# Patient Record
Sex: Male | Born: 1965
Health system: Southern US, Community
[De-identification: ages and names within clinical notes are randomized; demographics above are authoritative.]

## PROBLEM LIST (undated history)

## (undated) DIAGNOSIS — I10 Essential (primary) hypertension: Secondary | ICD-10-CM

## (undated) DIAGNOSIS — E785 Hyperlipidemia, unspecified: Secondary | ICD-10-CM

## (undated) DIAGNOSIS — M2392 Unspecified internal derangement of left knee: Secondary | ICD-10-CM

## (undated) DIAGNOSIS — K219 Gastro-esophageal reflux disease without esophagitis: Secondary | ICD-10-CM

## (undated) DIAGNOSIS — E119 Type 2 diabetes mellitus without complications: Secondary | ICD-10-CM

## (undated) DIAGNOSIS — E039 Hypothyroidism, unspecified: Secondary | ICD-10-CM

## (undated) DIAGNOSIS — M5432 Sciatica, left side: Secondary | ICD-10-CM

## (undated) HISTORY — DX: Hypothyroidism, unspecified: E03.9

## (undated) HISTORY — DX: Essential (primary) hypertension: I10

## (undated) HISTORY — PX: TONSILLECTOMY: SUR1361

## (undated) HISTORY — PX: LIPOMA EXCISION: SHX5283

## (undated) HISTORY — DX: Hyperlipidemia, unspecified: E78.5

---

## 2009-10-05 DIAGNOSIS — E118 Type 2 diabetes mellitus with unspecified complications: Secondary | ICD-10-CM | POA: Insufficient documentation

## 2009-10-05 DIAGNOSIS — E1169 Type 2 diabetes mellitus with other specified complication: Secondary | ICD-10-CM | POA: Insufficient documentation

## 2009-10-05 DIAGNOSIS — E1165 Type 2 diabetes mellitus with hyperglycemia: Secondary | ICD-10-CM | POA: Insufficient documentation

## 2013-08-08 ENCOUNTER — Ambulatory Visit: Payer: Self-pay | Admitting: Physical Medicine and Rehabilitation

## 2015-10-10 DIAGNOSIS — E119 Type 2 diabetes mellitus without complications: Secondary | ICD-10-CM | POA: Diagnosis not present

## 2015-10-10 DIAGNOSIS — Z0000 Encounter for general adult medical examination without abnormal findings: Secondary | ICD-10-CM | POA: Diagnosis not present

## 2015-10-10 DIAGNOSIS — Z125 Encounter for screening for malignant neoplasm of prostate: Secondary | ICD-10-CM | POA: Diagnosis not present

## 2015-10-10 DIAGNOSIS — I158 Other secondary hypertension: Secondary | ICD-10-CM | POA: Diagnosis not present

## 2015-10-10 DIAGNOSIS — E039 Hypothyroidism, unspecified: Secondary | ICD-10-CM | POA: Diagnosis not present

## 2015-10-10 DIAGNOSIS — R7889 Finding of other specified substances, not normally found in blood: Secondary | ICD-10-CM | POA: Diagnosis not present

## 2015-10-10 DIAGNOSIS — E784 Other hyperlipidemia: Secondary | ICD-10-CM | POA: Diagnosis not present

## 2015-10-10 DIAGNOSIS — E559 Vitamin D deficiency, unspecified: Secondary | ICD-10-CM | POA: Diagnosis not present

## 2015-10-16 ENCOUNTER — Telehealth: Payer: Self-pay | Admitting: Gastroenterology

## 2015-10-16 ENCOUNTER — Other Ambulatory Visit: Payer: Self-pay

## 2015-10-16 NOTE — Telephone Encounter (Signed)
Colonoscopy triage °

## 2015-10-16 NOTE — Telephone Encounter (Signed)
LVM for pt to return my call.

## 2015-10-16 NOTE — Telephone Encounter (Signed)
Pt scheduled for screening colonoscopy at [facility] on 11/04/15.

## 2015-10-16 NOTE — Telephone Encounter (Signed)
Gastroenterology Pre-Procedure Review  Request Date: 11/04/15 Requesting Physician: Dr. Rosario Jacks  PATIENT REVIEW QUESTIONS: The patient responded to the following health history questions as indicated:    1. Are you having any GI issues? yes (Hemorroids) 2. Do you have a personal history of Polyps? no 3. Do you have a family history of Colon Cancer or Polyps? no 4. Diabetes Mellitus? no 5. Joint replacements in the past 12 months?no 6. Major health problems in the past 3 months?no 7. Any artificial heart valves, MVP, or defibrillator?no    MEDICATIONS & ALLERGIES:    Patient reports the following regarding taking any anticoagulation/antiplatelet therapy:   Plavix, Coumadin, Eliquis, Xarelto, Lovenox, Pradaxa, Brilinta, or Effient? no Aspirin? no  Patient confirms/reports the following medications:  Current Outpatient Prescriptions  Medication Sig Dispense Refill  . levothyroxine (SYNTHROID, LEVOTHROID) 88 MCG tablet Take 88 mcg by mouth daily before breakfast.    . lisinopril (PRINIVIL,ZESTRIL) 10 MG tablet Take 10 mg by mouth daily.     No current facility-administered medications for this visit.    Patient confirms/reports the following allergies:  Allergies no known allergies  No orders of the defined types were placed in this encounter.    AUTHORIZATION INFORMATION Primary Insurance: 1D#: Group #:  Secondary Insurance: 1D#: Group #:  SCHEDULE INFORMATION: Date: 11/04/15 Time: Location: [place]

## 2015-10-30 NOTE — Telephone Encounter (Signed)
No authorization is required with [facility] per Sharee Pimple T for CPT: [phone]. 11/04/15 date of service.

## 2015-11-01 NOTE — Discharge Instructions (Signed)

## 2015-11-04 ENCOUNTER — Ambulatory Visit: Admission: RE | Admit: 2015-11-04 | Payer: 59 | Source: Ambulatory Visit | Admitting: Gastroenterology

## 2015-11-04 ENCOUNTER — Encounter: Admission: RE | Payer: Self-pay | Source: Ambulatory Visit

## 2015-11-04 SURGERY — COLONOSCOPY WITH PROPOFOL
Anesthesia: Choice

## 2015-11-14 ENCOUNTER — Other Ambulatory Visit: Payer: Self-pay

## 2015-11-19 ENCOUNTER — Encounter: Payer: Self-pay | Admitting: *Deleted

## 2015-11-21 NOTE — Discharge Instructions (Signed)

## 2015-11-25 ENCOUNTER — Encounter
Admission: RE | Disposition: A | Discharge status: Home / Self Care | Payer: Self-pay | Source: Ambulatory Visit | Attending: Gastroenterology

## 2015-11-25 ENCOUNTER — Ambulatory Visit: Payer: 59 | Admitting: Anesthesiology

## 2015-11-25 ENCOUNTER — Ambulatory Visit
Admission: RE | Admit: 2015-11-25 | Discharge: 2015-11-25 | Disposition: A | Discharge status: Home / Self Care | Payer: 59 | Source: Ambulatory Visit | Attending: Gastroenterology | Admitting: Gastroenterology

## 2015-11-25 DIAGNOSIS — F17290 Nicotine dependence, other tobacco product, uncomplicated: Secondary | ICD-10-CM | POA: Insufficient documentation

## 2015-11-25 DIAGNOSIS — D124 Benign neoplasm of descending colon: Secondary | ICD-10-CM | POA: Insufficient documentation

## 2015-11-25 DIAGNOSIS — E119 Type 2 diabetes mellitus without complications: Secondary | ICD-10-CM | POA: Insufficient documentation

## 2015-11-25 DIAGNOSIS — D125 Benign neoplasm of sigmoid colon: Secondary | ICD-10-CM | POA: Diagnosis not present

## 2015-11-25 DIAGNOSIS — Z1211 Encounter for screening for malignant neoplasm of colon: Secondary | ICD-10-CM | POA: Insufficient documentation

## 2015-11-25 DIAGNOSIS — Z79899 Other long term (current) drug therapy: Secondary | ICD-10-CM | POA: Insufficient documentation

## 2015-11-25 DIAGNOSIS — K641 Second degree hemorrhoids: Secondary | ICD-10-CM | POA: Insufficient documentation

## 2015-11-25 DIAGNOSIS — K635 Polyp of colon: Secondary | ICD-10-CM | POA: Diagnosis not present

## 2015-11-25 HISTORY — DX: Type 2 diabetes mellitus without complications: E11.9

## 2015-11-25 HISTORY — PX: COLONOSCOPY WITH PROPOFOL: SHX5780

## 2015-11-25 HISTORY — PX: POLYPECTOMY: SHX149

## 2015-11-25 HISTORY — DX: Sciatica, left side: M54.32

## 2015-11-25 SURGERY — COLONOSCOPY WITH PROPOFOL
Anesthesia: Monitor Anesthesia Care | Wound class: Contaminated

## 2015-11-25 MED ORDER — LACTATED RINGERS IV SOLN
INTRAVENOUS | Status: DC
  Administered 2015-11-25: 10:00:00 via INTRAVENOUS

## 2015-11-25 MED ORDER — PROPOFOL 10 MG/ML IV BOLUS
INTRAVENOUS | Status: DC | PRN
  Administered 2015-11-25: 30 mg via INTRAVENOUS
  Administered 2015-11-25: 50 mg via INTRAVENOUS
  Administered 2015-11-25 (×6): 20 mg via INTRAVENOUS

## 2015-11-25 MED ORDER — LIDOCAINE HCL (CARDIAC) 20 MG/ML IV SOLN
INTRAVENOUS | Status: DC | PRN
  Administered 2015-11-25: 50 mg via INTRAVENOUS

## 2015-11-25 SURGICAL SUPPLY — 28 items
CANISTER SUCT 1200ML W/VALVE (MISCELLANEOUS) ×4 IMPLANT
FCP ESCP3.2XJMB 240X2.8X (MISCELLANEOUS)
FORCEPS BIOP RAD 4 LRG CAP 4 (CUTTING FORCEPS) IMPLANT
FORCEPS BIOP RJ4 240 W/NDL (MISCELLANEOUS)
FORCEPS ESCP3.2XJMB 240X2.8X (MISCELLANEOUS) IMPLANT
GOWN CVR UNV OPN BCK APRN NK (MISCELLANEOUS) ×4 IMPLANT
GOWN ISOL THUMB LOOP REG UNIV (MISCELLANEOUS) ×4
HEMOCLIP INSTINCT (CLIP) IMPLANT
INJECTOR VARIJECT VIN23 (MISCELLANEOUS) IMPLANT
KIT CO2 TUBING (TUBING) IMPLANT
KIT DEFENDO VALVE AND CONN (KITS) IMPLANT
KIT ENDO PROCEDURE OLY (KITS) ×4 IMPLANT
LIGATOR MULTIBAND 6SHOOTER MBL (MISCELLANEOUS) IMPLANT
MARKER SPOT ENDO TATTOO 5ML (MISCELLANEOUS) IMPLANT
PAD GROUND ADULT SPLIT (MISCELLANEOUS) IMPLANT
SNARE SHORT THROW 13M SML OVAL (MISCELLANEOUS) ×4 IMPLANT
SNARE SHORT THROW 30M LRG OVAL (MISCELLANEOUS) IMPLANT
SPOT EX ENDOSCOPIC TATTOO (MISCELLANEOUS)
SUCTION POLY TRAP 4CHAMBER (MISCELLANEOUS) IMPLANT
TRAP SUCTION POLY (MISCELLANEOUS) ×4 IMPLANT
TUBING CONN 6MMX3.1M (TUBING)
TUBING SUCTION CONN 0.25 STRL (TUBING) IMPLANT
UNDERPAD 30X60 958B10 (PK) (MISCELLANEOUS) IMPLANT
VALVE BIOPSY ENDO (VALVE) IMPLANT
VARIJECT INJECTOR VIN23 (MISCELLANEOUS)
WATER AUXILLARY (MISCELLANEOUS) IMPLANT
WATER STERILE IRR 250ML POUR (IV SOLUTION) ×4 IMPLANT
WATER STERILE IRR 500ML POUR (IV SOLUTION) IMPLANT

## 2015-11-25 NOTE — Transfer of Care (Signed)
Immediate Anesthesia Transfer of Care Note  Patient: Jonathan Thomas  Procedure(s) Performed: Procedure(s) with comments: COLONOSCOPY WITH PROPOFOL (N/A) POLYPECTOMY INTESTINAL - Descending colon polyp Sigmoid colon polyp Both cold snare.  Patient Location: PACU  Anesthesia Type: MAC  Level of Consciousness: awake, alert  and patient cooperative  Airway and Oxygen Therapy: Patient Spontanous Breathing and Patient connected to supplemental oxygen  Post-op Assessment: Post-op Vital signs reviewed, Patient's Cardiovascular Status Stable, Respiratory Function Stable, Patent Airway and No signs of Nausea or vomiting  Post-op Vital Signs: Reviewed and stable  Complications: No apparent anesthesia complications

## 2015-11-25 NOTE — H&P (Signed)
  [facility]  [address] [place], [place] [address] Phone: [phone] Fax : [phone]  Primary Care Physician:  No primary care provider on file. Primary Gastroenterologist:  Dr. Allen Norris  Pre-Procedure History & Physical: HPI:  Jonathan Thomas is a 50 y.o. male is here for a screening colonoscopy.   Past Medical History  Diagnosis Date  . Diabetes mellitus without complication ([place])     in past - no meds since 2012  . Sciatica of left side     Past Surgical History  Procedure Laterality Date  . Tonsillectomy      as child    Prior to Admission medications   Medication Sig Start Date End Date Taking? Authorizing Provider  levothyroxine (SYNTHROID, LEVOTHROID) 88 MCG tablet Take 88 mcg by mouth daily before breakfast.   Yes Historical Provider, MD  lisinopril (PRINIVIL,ZESTRIL) 10 MG tablet Take 10 mg by mouth daily. Only takes about 1x/wk   Yes Historical Provider, MD  Multiple Vitamin (MULTIVITAMIN) capsule Take 1 capsule by mouth daily.   Yes Historical Provider, MD  Omega-3 Fatty Acids (FISH OIL PO) Take by mouth daily.   Yes Historical Provider, MD    Allergies as of 11/14/2015  . (No Known Allergies)    History reviewed. No pertinent family history.  Social History   Social History  . Marital Status: Married    Spouse Name: N/A  . Number of Children: N/A  . Years of Education: N/A   Occupational History  . Not on file.   Social History Main Topics  . Smoking status: Current Some Day Smoker    Types: Cigars  . Smokeless tobacco: Not on file     Comment: 1 cigar/wk.  Quit cigarettes 25+ yrs ago.  . Alcohol Use: No  . Drug Use: Not on file  . Sexual Activity: Not on file   Other Topics Concern  . Not on file   Social History Narrative    Review of Systems: See HPI, otherwise negative ROS  Physical Exam: BP 126/84 mmHg  Pulse 71  Temp(Src) 97.7 F (36.5 C) (Temporal)  Resp 16  Ht 5\' 8"  (1.727 m)  Wt 172 lb (78.019  kg)  BMI 26.16 kg/m2  SpO2 95% General:   Alert,  pleasant and cooperative in NAD Head:  Normocephalic and atraumatic. Neck:  Supple; no masses or thyromegaly. Lungs:  Clear throughout to auscultation.    Heart:  Regular rate and rhythm. Abdomen:  Soft, nontender and nondistended. Normal bowel sounds, without guarding, and without rebound.   Neurologic:  Alert and  oriented x4;  grossly normal neurologically.  Impression/Plan: Jonathan Thomas is now here to undergo a screening colonoscopy.  Risks, benefits, and alternatives regarding colonoscopy have been reviewed with the patient.  Questions have been answered.  All parties agreeable.

## 2015-11-25 NOTE — Op Note (Signed)
[facility] Gastroenterology Patient Name: Jonathan Thomas Procedure Date: 11/25/2015 10:50 AM MRN: [id number] Account #: [id number] Date of Birth: 09/20/1966 Admit Type: Outpatient Age: 50 Room: [facility] OR ROOM 01 Gender: Male Note Status: Finalized Procedure:            Colonoscopy Indications:          Screening for colorectal malignant neoplasm Providers:            Lucilla Lame, MD Referring MD:         Casilda Carls, MD (Referring MD) Medicines:            Propofol per Anesthesia Complications:        No immediate complications. Procedure:            Pre-Anesthesia Assessment:                       - Prior to the procedure, a History and Physical was                        performed, and patient medications and allergies were                        reviewed. The patient's tolerance of previous                        anesthesia was also reviewed. The risks and benefits of                        the procedure and the sedation options and risks were                        discussed with the patient. All questions were                        answered, and informed consent was obtained. Prior                        Anticoagulants: The patient has taken no previous                        anticoagulant or antiplatelet agents. ASA Grade                        Assessment: II - A patient with mild systemic disease.                        After reviewing the risks and benefits, the patient was                        deemed in satisfactory condition to undergo the                        procedure.                       After obtaining informed consent, the colonoscope was                        passed under direct vision. Throughout the procedure,  the patient's blood pressure, pulse, and oxygen                        saturations were monitored continuously. The Olympus CF                        H180AL colonoscope (S#: [id number]) was introduced through                        the anus and advanced to the the cecum, identified by                        appendiceal orifice and ileocecal valve. The                        colonoscopy was performed without difficulty. The                        patient tolerated the procedure well. The quality of                        the bowel preparation was excellent. Findings:      The perianal and digital rectal examinations were normal.      A 4 mm polyp was found in the descending colon. The polyp was sessile.       The polyp was removed with a cold snare. Resection and retrieval were       complete.      Two sessile polyps were found in the sigmoid colon. The polyps were 3 to       4 mm in size. These polyps were removed with a cold snare. Resection and       retrieval were complete.      Non-bleeding internal hemorrhoids were found during retroflexion. The       hemorrhoids were Grade II (internal hemorrhoids that prolapse but reduce       spontaneously). Impression:           - One 4 mm polyp in the descending colon, removed with                        a cold snare. Resected and retrieved.                       - Two 3 to 4 mm polyps in the sigmoid colon, removed                        with a cold snare. Resected and retrieved.                       - Non-bleeding internal hemorrhoids. Recommendation:       - Await pathology results.                       - Repeat colonoscopy in 5 years if polyp adenoma and 10                        years if hyperplastic Procedure Code(s):    --- Professional ---  45385, Colonoscopy, flexible; with removal of tumor(s),                        polyp(s), or other lesion(s) by snare technique Diagnosis Code(s):    --- Professional ---                       Z12.11, Encounter for screening for malignant neoplasm                        of colon                       D12.4, Benign neoplasm of descending colon                       D12.5, Benign  neoplasm of sigmoid colon CPT copyright 2016 American Medical Association. All rights reserved. The codes documented in this report are preliminary and upon coder review may  be revised to meet current compliance requirements. Lucilla Lame, MD 11/25/2015 11:17:23 AM This report has been signed electronically. Number of Addenda: 0 Note Initiated On: 11/25/2015 10:50 AM Scope Withdrawal Time: 0 hours 10 minutes 13 seconds  Total Procedure Duration: 0 hours 11 minutes 12 seconds       [facility]

## 2015-11-25 NOTE — Anesthesia Postprocedure Evaluation (Signed)
Anesthesia Post Note  Patient: Jonathan Thomas  Procedure(s) Performed: Procedure(s) (LRB): COLONOSCOPY WITH PROPOFOL (N/A) POLYPECTOMY INTESTINAL  Patient location during evaluation: PACU Anesthesia Type: MAC Level of consciousness: awake and alert Pain management: pain level controlled Vital Signs Assessment: post-procedure vital signs reviewed and stable Respiratory status: spontaneous breathing, nonlabored ventilation, respiratory function stable and patient connected to nasal cannula oxygen Cardiovascular status: blood pressure returned to baseline and stable Postop Assessment: no signs of nausea or vomiting Anesthetic complications: no    Alisa Graff

## 2015-11-25 NOTE — Anesthesia Preprocedure Evaluation (Signed)
Anesthesia Evaluation  Patient identified by MRN, date of birth, ID band Patient awake    Reviewed: Allergy & Precautions, H&P , NPO status , Patient's Chart, lab work & pertinent test results, reviewed documented beta blocker date and time   Airway Mallampati: II  TM Distance: >3 FB Neck ROM: full    Dental no notable dental hx.    Pulmonary neg pulmonary ROS, Current Smoker,    Pulmonary exam normal breath sounds clear to auscultation       Cardiovascular Exercise Tolerance: Good negative cardio ROS   Rhythm:regular Rate:Normal     Neuro/Psych negative neurological ROS  negative psych ROS   GI/Hepatic negative GI ROS, Neg liver ROS,   Endo/Other  diabetes  Renal/GU negative Renal ROS  negative genitourinary   Musculoskeletal   Abdominal   Peds  Hematology negative hematology ROS (+)   Anesthesia Other Findings   Reproductive/Obstetrics negative OB ROS                             Anesthesia Physical Anesthesia Plan  ASA: II  Anesthesia Plan: MAC   Post-op Pain Management:    Induction:   Airway Management Planned:   Additional Equipment:   Intra-op Plan:   Post-operative Plan:   Informed Consent: I have reviewed the patients History and Physical, chart, labs and discussed the procedure including the risks, benefits and alternatives for the proposed anesthesia with the patient or authorized representative who has indicated his/her understanding and acceptance.   Dental Advisory Given  Plan Discussed with: CRNA  Anesthesia Plan Comments:         Anesthesia Quick Evaluation

## 2015-11-25 NOTE — Anesthesia Procedure Notes (Signed)
Procedure Name: MAC Performed by: Jaylene Arrowood Pre-anesthesia Checklist: Patient identified, Emergency Drugs available, Suction available, Timeout performed and Patient being monitored Patient Re-evaluated:Patient Re-evaluated prior to inductionOxygen Delivery Method: Nasal cannula Placement Confirmation: positive ETCO2       

## 2015-11-26 ENCOUNTER — Encounter: Payer: Self-pay | Admitting: Gastroenterology

## 2015-11-28 ENCOUNTER — Encounter: Payer: Self-pay | Admitting: Gastroenterology

## 2016-01-10 ENCOUNTER — Other Ambulatory Visit: Payer: Self-pay | Admitting: Internal Medicine

## 2016-01-10 ENCOUNTER — Encounter: Payer: Self-pay | Admitting: General Surgery

## 2016-01-10 DIAGNOSIS — R7889 Finding of other specified substances, not normally found in blood: Secondary | ICD-10-CM | POA: Diagnosis not present

## 2016-01-10 DIAGNOSIS — E119 Type 2 diabetes mellitus without complications: Secondary | ICD-10-CM | POA: Diagnosis not present

## 2016-01-10 DIAGNOSIS — R1030 Lower abdominal pain, unspecified: Secondary | ICD-10-CM

## 2016-01-10 DIAGNOSIS — E559 Vitamin D deficiency, unspecified: Secondary | ICD-10-CM | POA: Diagnosis not present

## 2016-01-10 DIAGNOSIS — E032 Hypothyroidism due to medicaments and other exogenous substances: Secondary | ICD-10-CM | POA: Diagnosis not present

## 2016-01-10 DIAGNOSIS — E784 Other hyperlipidemia: Secondary | ICD-10-CM | POA: Diagnosis not present

## 2016-01-10 DIAGNOSIS — R109 Unspecified abdominal pain: Secondary | ICD-10-CM

## 2016-01-10 DIAGNOSIS — E039 Hypothyroidism, unspecified: Secondary | ICD-10-CM | POA: Diagnosis not present

## 2016-01-10 DIAGNOSIS — E781 Pure hyperglyceridemia: Secondary | ICD-10-CM | POA: Diagnosis not present

## 2016-01-14 ENCOUNTER — Ambulatory Visit
Admission: RE | Admit: 2016-01-14 | Discharge: 2016-01-14 | Disposition: A | Discharge status: Home / Self Care | Payer: 59 | Source: Ambulatory Visit | Attending: Internal Medicine | Admitting: Internal Medicine

## 2016-01-14 DIAGNOSIS — R1030 Lower abdominal pain, unspecified: Secondary | ICD-10-CM | POA: Diagnosis not present

## 2016-01-14 DIAGNOSIS — K760 Fatty (change of) liver, not elsewhere classified: Secondary | ICD-10-CM | POA: Diagnosis not present

## 2016-01-14 DIAGNOSIS — R109 Unspecified abdominal pain: Secondary | ICD-10-CM | POA: Diagnosis not present

## 2016-01-14 DIAGNOSIS — I700 Atherosclerosis of aorta: Secondary | ICD-10-CM | POA: Insufficient documentation

## 2016-01-14 MED ORDER — IOPAMIDOL (ISOVUE-300) INJECTION 61%
100.0000 mL | Freq: Once | INTRAVENOUS | Status: AC | PRN
  Administered 2016-01-14: 100 mL via INTRAVENOUS

## 2016-01-23 ENCOUNTER — Ambulatory Visit: Payer: Self-pay | Admitting: General Surgery

## 2016-02-11 ENCOUNTER — Encounter: Payer: Self-pay | Admitting: General Surgery

## 2016-02-11 ENCOUNTER — Ambulatory Visit (INDEPENDENT_AMBULATORY_CARE_PROVIDER_SITE_OTHER): Payer: 59 | Admitting: General Surgery

## 2016-02-11 VITALS — BP 130/82 | HR 68 | Resp 12 | Ht 68.0 in | Wt 177.0 lb

## 2016-02-11 DIAGNOSIS — M7989 Other specified soft tissue disorders: Secondary | ICD-10-CM

## 2016-02-11 DIAGNOSIS — M799 Soft tissue disorder, unspecified: Secondary | ICD-10-CM

## 2016-02-11 NOTE — Progress Notes (Signed)
Patient ID: Jonathan Thomas, male   DOB: 1966/08/15, 50 y.o.   MRN: [id number]  Chief Complaint  Patient presents with  . Other    lipoma    HPI Jonathan Thomas is a 50 y.o. male here today for a evaluation of a lipoma on left side of neck. Patient states he noticed this many years ago. He states it has gotten larger over the past 5 years. Denies pain.  He states he had a MD did an I/D when he was in his 32's. I have reviewed the history of present illness with the patient.   HPI  Past Medical History  Diagnosis Date  . Sciatica of left side   . Diabetes mellitus without complication ([place])     in past - no meds since 2012    Past Surgical History  Procedure Laterality Date  . Tonsillectomy      as child  . Colonoscopy with propofol N/A 11/25/2015    Procedure: COLONOSCOPY WITH PROPOFOL;  Surgeon: Lucilla Lame, MD;  Location: [place];  Service: Endoscopy;  Laterality: N/A;  . Polypectomy  11/25/2015    Procedure: POLYPECTOMY INTESTINAL;  Surgeon: Lucilla Lame, MD;  Location: [place];  Service: Endoscopy;;  Descending colon polyp Sigmoid colon polyp Both cold snare.    Family History  Problem Relation Age of Onset  . Lung cancer Father     Social History Social History  Substance Use Topics  . Smoking status: Current Some Day Smoker    Types: Cigars  . Smokeless tobacco: Never Used     Comment: 1 cigar/wk.  Quit cigarettes 25+ yrs ago.  . Alcohol Use: No    No Known Allergies  Current Outpatient Prescriptions  Medication Sig Dispense Refill  . atorvastatin (LIPITOR) 10 MG tablet Take 10 mg by mouth daily.    Marland Kitchen levothyroxine (SYNTHROID, LEVOTHROID) 88 MCG tablet Take 88 mcg by mouth daily before breakfast.    . Multiple Vitamin (MULTIVITAMIN) capsule Take 1 capsule by mouth daily.    . Omega-3 Fatty Acids (FISH OIL PO) Take by mouth daily.     No current facility-administered medications for this visit.    Review of Systems Review of  Systems  Constitutional: Negative.   Respiratory: Negative.   Cardiovascular: Negative.     Blood pressure 130/82, pulse 68, resp. rate 12, height 5\' 8"  (1.727 m), weight 177 lb (80.287 kg).  Physical Exam Physical Exam  Constitutional: He is oriented to person, place, and time. He appears well-developed and well-nourished.    HENT:  Mouth/Throat: Oropharynx is clear and moist.  Eyes: Conjunctivae are normal. No scleral icterus.  Cardiovascular: Normal rate and regular rhythm.   Pulmonary/Chest: Effort normal and breath sounds normal.  Lymphadenopathy:    He has no cervical adenopathy.  Neurological: He is alert and oriented to person, place, and time.  Skin: Skin is warm and dry.  Psychiatric: He has a normal mood and affect.    Data Reviewed Notes reviewed  Assessment  Well defined, non-tender mass. Initial impression is that mass is a fatty tumor.      Plan Discussed options with pt . Given conttinued increase in size he is agreeable to having it excised .    Patient to return to the office for excision with local anesthetic.     PCP:  Casilda Carls This information has been scribed by Karie Fetch RN, BSN,BC.    Torsha Lemus G 02/11/2016, 2:07 PM

## 2016-02-19 ENCOUNTER — Encounter: Payer: Self-pay | Admitting: General Surgery

## 2016-02-19 ENCOUNTER — Ambulatory Visit (INDEPENDENT_AMBULATORY_CARE_PROVIDER_SITE_OTHER): Payer: 59 | Admitting: General Surgery

## 2016-02-19 VITALS — BP 164/70 | HR 80 | Resp 12 | Ht 68.0 in | Wt 175.0 lb

## 2016-02-19 DIAGNOSIS — D170 Benign lipomatous neoplasm of skin and subcutaneous tissue of head, face and neck: Secondary | ICD-10-CM | POA: Diagnosis not present

## 2016-02-19 DIAGNOSIS — R221 Localized swelling, mass and lump, neck: Secondary | ICD-10-CM | POA: Diagnosis not present

## 2016-02-19 NOTE — Patient Instructions (Addendum)
Keep area clean and dry. You may shower after tomorrow. You may change the dressing daily. Leave the steri strips in place they will come off on their own. We will call with the pathology results.

## 2016-02-19 NOTE — Progress Notes (Signed)
Patient ID: Jonathan Thomas, male   DOB: Mar 26, 1966, 50 y.o.   MRN: [id number] Here for a scheduled excision of a lipoma from his neck.   Procedure: excision mass back of neck   Anesthetic: 10 ml 1% xylocaine mixed with 0.5% marcaine, 55ml plain 1% xylocaine  Prep: chloro prep  Description. Pt placed in prone position. After local anesthetic and prep, area was draped out. Elliptical skin incision made . The lipomatous mass was excised out fully. The deep part was adherent to the fascia ans part of fascia was removed with the mass. Bleeding controlled with cautery and sutures of 3-0 vicryl. Deep layers closed with 3-0 Vicryl. Skin closed with subcuticular 4-0 Vicryl. Steri strips with tr. Benzoin applied. Gauze dressing. No immediate problems from procedure.  Ex given for Tramadol 50mg   q6h prn, # 20  Follow up in 2 weeks.  PCP: Rosario Jacks This has been scribed by Lesly Rubenstein LPN

## 2016-02-20 ENCOUNTER — Encounter: Payer: Self-pay | Admitting: General Surgery

## 2016-02-24 ENCOUNTER — Telehealth: Payer: Self-pay | Admitting: *Deleted

## 2016-02-24 NOTE — Telephone Encounter (Signed)
-----   Message from Christene Lye, MD sent at 02/24/2016  1:04 PM EDT ----- Selinda Eon, please let pt pt know the pathology was normal.

## 2016-02-24 NOTE — Telephone Encounter (Signed)
Patient notified as instructed and pleased.   He was advised to keep follow up appointment with Dr. Jamal Collin as scheduled.

## 2016-03-09 ENCOUNTER — Encounter: Payer: Self-pay | Admitting: *Deleted

## 2016-03-10 ENCOUNTER — Encounter: Payer: Self-pay | Admitting: General Surgery

## 2016-03-10 ENCOUNTER — Ambulatory Visit (INDEPENDENT_AMBULATORY_CARE_PROVIDER_SITE_OTHER): Payer: 59 | Admitting: General Surgery

## 2016-03-10 VITALS — BP 118/78 | HR 60 | Resp 12 | Ht 68.0 in | Wt 178.0 lb

## 2016-03-10 DIAGNOSIS — R221 Localized swelling, mass and lump, neck: Secondary | ICD-10-CM

## 2016-03-10 NOTE — Patient Instructions (Addendum)
Patient to return as needed. 

## 2016-03-10 NOTE — Progress Notes (Signed)
This is a 50 year old male here today for his post op excision lipoma from back of neck. He states he is doing well. Minimal to no pain. I have reviewed the history of present illness with the patient.  Exam shows a well healed incision . No residual visible or palpable mass.  Path-lipoma. Pt advised.    Patient to return as needed.  PCP:  Rosario Jacks,     This information has been scribed by Gaspar Cola CMA.

## 2016-03-14 ENCOUNTER — Encounter: Payer: Self-pay | Admitting: General Surgery

## 2016-04-13 DIAGNOSIS — I158 Other secondary hypertension: Secondary | ICD-10-CM | POA: Diagnosis not present

## 2016-04-13 DIAGNOSIS — E781 Pure hyperglyceridemia: Secondary | ICD-10-CM | POA: Diagnosis not present

## 2016-04-13 DIAGNOSIS — E784 Other hyperlipidemia: Secondary | ICD-10-CM | POA: Diagnosis not present

## 2016-04-13 DIAGNOSIS — E039 Hypothyroidism, unspecified: Secondary | ICD-10-CM | POA: Diagnosis not present

## 2016-04-13 DIAGNOSIS — I10 Essential (primary) hypertension: Secondary | ICD-10-CM | POA: Diagnosis not present

## 2016-04-13 DIAGNOSIS — E032 Hypothyroidism due to medicaments and other exogenous substances: Secondary | ICD-10-CM | POA: Diagnosis not present

## 2016-04-13 DIAGNOSIS — R7889 Finding of other specified substances, not normally found in blood: Secondary | ICD-10-CM | POA: Diagnosis not present

## 2016-04-13 DIAGNOSIS — E559 Vitamin D deficiency, unspecified: Secondary | ICD-10-CM | POA: Diagnosis not present

## 2016-06-18 DIAGNOSIS — J309 Allergic rhinitis, unspecified: Secondary | ICD-10-CM | POA: Diagnosis not present

## 2016-07-15 DIAGNOSIS — I158 Other secondary hypertension: Secondary | ICD-10-CM | POA: Diagnosis not present

## 2016-07-15 DIAGNOSIS — E032 Hypothyroidism due to medicaments and other exogenous substances: Secondary | ICD-10-CM | POA: Diagnosis not present

## 2016-07-15 DIAGNOSIS — E039 Hypothyroidism, unspecified: Secondary | ICD-10-CM | POA: Diagnosis not present

## 2016-07-15 DIAGNOSIS — R7889 Finding of other specified substances, not normally found in blood: Secondary | ICD-10-CM | POA: Diagnosis not present

## 2016-07-15 DIAGNOSIS — I10 Essential (primary) hypertension: Secondary | ICD-10-CM | POA: Diagnosis not present

## 2016-07-15 DIAGNOSIS — R739 Hyperglycemia, unspecified: Secondary | ICD-10-CM | POA: Diagnosis not present

## 2016-07-15 DIAGNOSIS — E784 Other hyperlipidemia: Secondary | ICD-10-CM | POA: Diagnosis not present

## 2016-07-15 DIAGNOSIS — E781 Pure hyperglyceridemia: Secondary | ICD-10-CM | POA: Diagnosis not present

## 2016-08-17 DIAGNOSIS — R05 Cough: Secondary | ICD-10-CM | POA: Diagnosis not present

## 2016-10-13 DIAGNOSIS — E559 Vitamin D deficiency, unspecified: Secondary | ICD-10-CM | POA: Diagnosis not present

## 2016-10-13 DIAGNOSIS — E032 Hypothyroidism due to medicaments and other exogenous substances: Secondary | ICD-10-CM | POA: Diagnosis not present

## 2016-10-13 DIAGNOSIS — R7989 Other specified abnormal findings of blood chemistry: Secondary | ICD-10-CM | POA: Diagnosis not present

## 2016-10-13 DIAGNOSIS — R0689 Other abnormalities of breathing: Secondary | ICD-10-CM | POA: Diagnosis not present

## 2016-10-13 DIAGNOSIS — Z0000 Encounter for general adult medical examination without abnormal findings: Secondary | ICD-10-CM | POA: Diagnosis not present

## 2016-10-13 DIAGNOSIS — R7889 Finding of other specified substances, not normally found in blood: Secondary | ICD-10-CM | POA: Diagnosis not present

## 2016-10-13 DIAGNOSIS — Z125 Encounter for screening for malignant neoplasm of prostate: Secondary | ICD-10-CM | POA: Diagnosis not present

## 2016-10-13 DIAGNOSIS — E039 Hypothyroidism, unspecified: Secondary | ICD-10-CM | POA: Diagnosis not present

## 2016-10-13 DIAGNOSIS — E784 Other hyperlipidemia: Secondary | ICD-10-CM | POA: Diagnosis not present

## 2016-10-13 DIAGNOSIS — R0600 Dyspnea, unspecified: Secondary | ICD-10-CM | POA: Diagnosis not present

## 2016-10-29 ENCOUNTER — Ambulatory Visit (INDEPENDENT_AMBULATORY_CARE_PROVIDER_SITE_OTHER): Payer: 59 | Admitting: Podiatry

## 2016-10-29 ENCOUNTER — Encounter: Payer: Self-pay | Admitting: Podiatry

## 2016-10-29 ENCOUNTER — Ambulatory Visit (INDEPENDENT_AMBULATORY_CARE_PROVIDER_SITE_OTHER): Payer: 59

## 2016-10-29 VITALS — BP 139/94 | HR 70 | Resp 16

## 2016-10-29 DIAGNOSIS — L609 Nail disorder, unspecified: Secondary | ICD-10-CM | POA: Diagnosis not present

## 2016-10-29 DIAGNOSIS — L600 Ingrowing nail: Secondary | ICD-10-CM | POA: Diagnosis not present

## 2016-10-29 DIAGNOSIS — M7741 Metatarsalgia, right foot: Secondary | ICD-10-CM

## 2016-10-29 DIAGNOSIS — M7742 Metatarsalgia, left foot: Secondary | ICD-10-CM

## 2016-10-29 DIAGNOSIS — R52 Pain, unspecified: Secondary | ICD-10-CM

## 2016-10-29 DIAGNOSIS — B351 Tinea unguium: Secondary | ICD-10-CM | POA: Diagnosis not present

## 2016-10-29 DIAGNOSIS — Q828 Other specified congenital malformations of skin: Secondary | ICD-10-CM

## 2016-10-29 MED ORDER — CEPHALEXIN 500 MG PO CAPS: 500.0000 mg | ORAL_CAPSULE | Freq: Three times a day (TID) | ORAL | 2 refills | Status: DC

## 2016-10-29 NOTE — Patient Instructions (Signed)

## 2016-10-29 NOTE — Progress Notes (Signed)
Subjective:    Patient ID: [name], male    DOB: 1965-10-14, 51 y.o.   MRN: [id number]  HPI 51 year old male presents the office today for concerns of painful calluses left foot is in ongoing for about 2 months as well as an ingrown sounds a left big toe. He states the toe is painful with pressure in shoes this is on the medial nail border. He has tried trim the nail himself and he continues to have pain. He states the callus of the outside aspect of the left foot is also painful to pressure in shoes. He said no recent treatment for this. He denies any redness or drainage or any swelling. He has some occasional pain to the right forefoot as he does lateral walking and standing at work this is intermittent and currently not having any discomfort. He points the ball of the majority of symptoms. No recent injury or trauma. No swelling. No other complaints at this time.   He is diabetic and states his blood sugar was about 100 this morning. He denies any claudication symptoms and denies any numbness or tingling.   Review of Systems  All other systems reviewed and are negative.      Objective:   Physical Exam General: AAO x3, NAD  Dermatological: There is incurvation along the medial nail border the left hallux toenail with tenderness palpation and there is localized edema. There is no erythema, drainage or pus or clinical signs of infection. Deep, punctate, annular hyperkeratotic lesion is present left foot submetatarsal 5. Upon debridement there is no underlying ulceration, drainage or evidence of verruca. This appears to be a porokeratosis. There is no other open lesions or pre-ulcerative lesions identified bilaterally.  Vascular: Dorsalis Pedis artery and Posterior Tibial artery pedal pulses are 2/4 bilateral with immedate capillary fill time. Pedal hair growth present. NThere is no pain with calf compression, swelling, warmth, erythema.   Neruologic: Grossly intact via light touch  bilateral. Vibratory intact via tuning fork bilateral. Protective threshold with Semmes Wienstein monofilament intact to all pedal sites bilateral.   Musculoskeletal: There is no area pinpoint any tenderness or pain the vibratory sensation bilaterally. Upon palpation of submetatarsal heads 105 on the right foot he subjective states this is where he has discomfort at times. Curling denies any pain. There is no overlying edema, erythema, increase in warmth. Muscular strength 5/5 in all groups tested bilateral.  Gait: Unassisted, Nonantalgic.      Assessment & Plan:  51 year old male left medial hallux symptomatic ingrown toenail, left foot porokeratosis, right metatarsalgia -Treatment options discussed including all alternatives, risks, and complications -Etiology of symptoms were discussed -At this time, the patient is requesting partial nail removal with chemical matricectomy to the symptomatic portion of the nail. Risks and complications were discussed with the patient for which they understand and  verbally consent to the procedure. Under sterile conditions a total of 3 mL of a mixture of 2% lidocaine plain and 0.5% Marcaine plain was infiltrated in a hallux block fashion. Once anesthetized, the skin was prepped in sterile fashion. A tourniquet was then applied. Next the left medial aspect of hallux nail border was then sharply excised making sure to remove the entire offending nail border. Once the nails were ensured to be removed area was debrided and the underlying skin was intact. There is no purulence identified in the procedure. Next phenol was then applied under standard conditions and copiously irrigated. Silvadene was applied. A dry sterile dressing was applied.  After application of the dressing the tourniquet was removed and there is found to be an immediate capillary refill time to the digit. The patient tolerated the procedure well any complications. Post procedure instructions were  discussed the patient for which he verbally understood. Follow-up in one week for nail check or sooner if any problems are to arise. Discussed signs/symptoms of infection and directed to call the office immediately should any occur or go directly to the emergency room. In the meantime, encouraged to call the office with any questions, concerns, changes symptoms. -Porokeratosis was sharply debrided without complications or bleeding. We'll likely apply salicylic acid next appointment -Offloading pads to metatarsalgia dispensed -Daily foot inspection  Celesta Gentile, DPM

## 2016-11-05 ENCOUNTER — Ambulatory Visit (INDEPENDENT_AMBULATORY_CARE_PROVIDER_SITE_OTHER): Payer: Self-pay | Admitting: Podiatry

## 2016-11-05 ENCOUNTER — Encounter: Payer: Self-pay | Admitting: Podiatry

## 2016-11-05 DIAGNOSIS — Z9889 Other specified postprocedural states: Secondary | ICD-10-CM

## 2016-11-05 DIAGNOSIS — L600 Ingrowing nail: Secondary | ICD-10-CM

## 2016-11-05 NOTE — Patient Instructions (Signed)

## 2016-11-06 NOTE — Progress Notes (Signed)
Subjective: Jonathan Thomas is a 51 y.o.  male returns to office today for follow up evaluation after having left Hallux medial partial nail avulsion performed. Patient has been soaking using epsom salts and applying topical antibiotic covered with bandaid daily up until the last few days. He states that the area feels "great'. He has not had any drainage, pus, redness. Patient denies fevers, chills, nausea, vomiting. Denies any calf pain, chest pain, SOB.   Objective:  Vitals: Reviewed  General: Well developed, nourished, in no acute distress, alert and oriented x3   Dermatology: Skin is warm, dry and supple bilateral. Left hallux nail border appears to be clean, dry, with mild granular tissue and surrounding scab. There is no surrounding erythema, edema, drainage/purulence. The remaining nails appear unremarkable at this time. There are no other lesions or other signs of infection present.  Neurovascular status: Intact. No lower extremity swelling; No pain with calf compression bilateral.  Musculoskeletal: Decreased tenderness to palpation of the left hallux nail fold. Muscular strength within normal limits bilateral.   Assesement and Plan: S/p partial nail avulsion, doing well.   -Continue soaking in epsom salts twice a day followed by antibiotic ointment and a band-aid. Can leave uncovered at night. Continue this until completely healed.  -If the area has not healed in 2 weeks, call the office for follow-up appointment, or sooner if any problems arise.  -Monitor for any signs/symptoms of infection. Call the office immediately if any occur or go directly to the emergency room. Call with any questions/concerns.  Celesta Gentile, DPM

## 2016-11-10 ENCOUNTER — Telehealth: Payer: Self-pay | Admitting: *Deleted

## 2016-11-10 NOTE — Telephone Encounter (Addendum)
-----   Message from Trula Slade, DPM sent at 11/10/2016  8:20 AM EST ----- Negative for fungus. Please let him know. Thanks. Informed pt of Dr. Leigh Aurora review of results.

## 2016-11-13 DIAGNOSIS — J029 Acute pharyngitis, unspecified: Secondary | ICD-10-CM | POA: Diagnosis not present

## 2016-11-13 DIAGNOSIS — R05 Cough: Secondary | ICD-10-CM | POA: Diagnosis not present

## 2017-01-18 DIAGNOSIS — E559 Vitamin D deficiency, unspecified: Secondary | ICD-10-CM | POA: Diagnosis not present

## 2017-01-18 DIAGNOSIS — E784 Other hyperlipidemia: Secondary | ICD-10-CM | POA: Diagnosis not present

## 2017-01-18 DIAGNOSIS — R7889 Finding of other specified substances, not normally found in blood: Secondary | ICD-10-CM | POA: Diagnosis not present

## 2017-01-18 DIAGNOSIS — E032 Hypothyroidism due to medicaments and other exogenous substances: Secondary | ICD-10-CM | POA: Diagnosis not present

## 2017-01-19 DIAGNOSIS — E039 Hypothyroidism, unspecified: Secondary | ICD-10-CM | POA: Diagnosis not present

## 2017-01-19 DIAGNOSIS — M545 Low back pain: Secondary | ICD-10-CM | POA: Diagnosis not present

## 2017-01-19 DIAGNOSIS — R7889 Finding of other specified substances, not normally found in blood: Secondary | ICD-10-CM | POA: Diagnosis not present

## 2017-01-19 DIAGNOSIS — J309 Allergic rhinitis, unspecified: Secondary | ICD-10-CM | POA: Diagnosis not present

## 2017-01-19 DIAGNOSIS — E874 Mixed disorder of acid-base balance: Secondary | ICD-10-CM | POA: Diagnosis not present

## 2017-03-11 DIAGNOSIS — H5203 Hypermetropia, bilateral: Secondary | ICD-10-CM | POA: Diagnosis not present

## 2017-03-11 DIAGNOSIS — H524 Presbyopia: Secondary | ICD-10-CM | POA: Diagnosis not present

## 2017-03-11 DIAGNOSIS — E119 Type 2 diabetes mellitus without complications: Secondary | ICD-10-CM | POA: Diagnosis not present

## 2017-03-11 DIAGNOSIS — H52223 Regular astigmatism, bilateral: Secondary | ICD-10-CM | POA: Diagnosis not present

## 2017-03-26 DIAGNOSIS — J309 Allergic rhinitis, unspecified: Secondary | ICD-10-CM | POA: Diagnosis not present

## 2017-03-26 DIAGNOSIS — R21 Rash and other nonspecific skin eruption: Secondary | ICD-10-CM | POA: Diagnosis not present

## 2017-04-27 DIAGNOSIS — R7889 Finding of other specified substances, not normally found in blood: Secondary | ICD-10-CM | POA: Diagnosis not present

## 2017-04-27 DIAGNOSIS — I10 Essential (primary) hypertension: Secondary | ICD-10-CM | POA: Diagnosis not present

## 2017-04-27 DIAGNOSIS — E032 Hypothyroidism due to medicaments and other exogenous substances: Secondary | ICD-10-CM | POA: Diagnosis not present

## 2017-04-27 DIAGNOSIS — E784 Other hyperlipidemia: Secondary | ICD-10-CM | POA: Diagnosis not present

## 2017-04-27 DIAGNOSIS — I158 Other secondary hypertension: Secondary | ICD-10-CM | POA: Diagnosis not present

## 2017-04-27 DIAGNOSIS — R789 Finding of unspecified substance, not normally found in blood: Secondary | ICD-10-CM | POA: Diagnosis not present

## 2017-08-12 DIAGNOSIS — I10 Essential (primary) hypertension: Secondary | ICD-10-CM | POA: Diagnosis not present

## 2017-08-12 DIAGNOSIS — E039 Hypothyroidism, unspecified: Secondary | ICD-10-CM | POA: Diagnosis not present

## 2017-08-12 DIAGNOSIS — E559 Vitamin D deficiency, unspecified: Secondary | ICD-10-CM | POA: Diagnosis not present

## 2017-08-12 DIAGNOSIS — E785 Hyperlipidemia, unspecified: Secondary | ICD-10-CM | POA: Diagnosis not present

## 2017-08-13 DIAGNOSIS — E781 Pure hyperglyceridemia: Secondary | ICD-10-CM | POA: Diagnosis not present

## 2017-08-13 DIAGNOSIS — E559 Vitamin D deficiency, unspecified: Secondary | ICD-10-CM | POA: Diagnosis not present

## 2017-08-13 DIAGNOSIS — E039 Hypothyroidism, unspecified: Secondary | ICD-10-CM | POA: Diagnosis not present

## 2017-08-13 DIAGNOSIS — R7889 Finding of other specified substances, not normally found in blood: Secondary | ICD-10-CM | POA: Diagnosis not present

## 2017-08-13 DIAGNOSIS — I158 Other secondary hypertension: Secondary | ICD-10-CM | POA: Diagnosis not present

## 2017-08-20 IMAGING — CT CT ABD-PELV W/ CM
1 of 3 series · 14 of 32 positions shown, 19 images · IV contrast (APPLIED)
Comparison: None.

CLINICAL DATA: Lower mid abdominal pain radiating to groin for 3
years.

EXAM:
CT ABDOMEN AND PELVIS WITH CONTRAST
TECHNIQUE: Multidetector CT imaging of the abdomen and pelvis was performed
using the standard protocol following bolus administration of
intravenous contrast.
CONTRAST:  100mL OW6EMX-XYY IOPAMIDOL (OW6EMX-XYY) INJECTION 61%

[Series 2: axial st · axial · 0.72mm/px · z∈[-971,-571]mm · 14 of 90 slices shown, 19 images]
[im 5/90  soft-tissue]
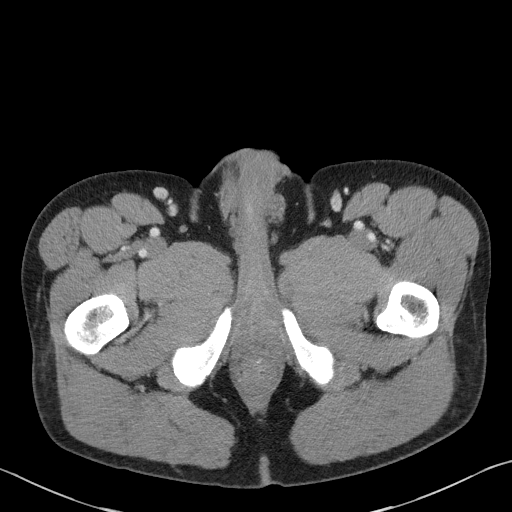
[im 5/90  bone]
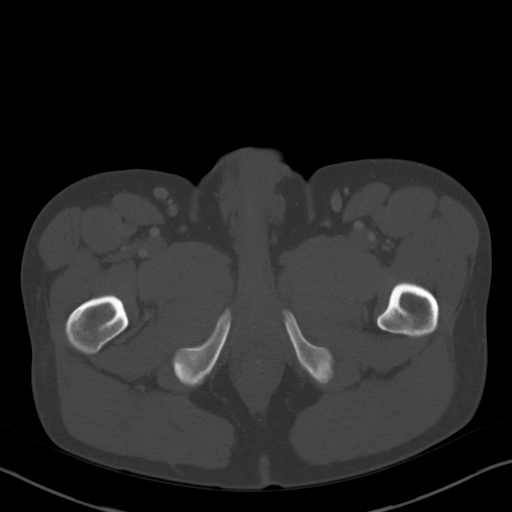
[im 15/90  soft-tissue]
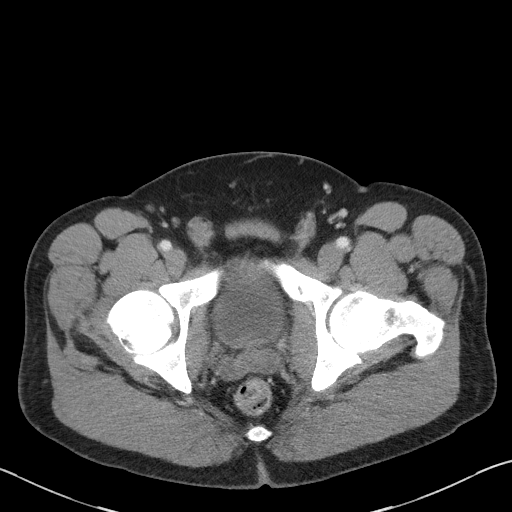
[im 19/90  soft-tissue]
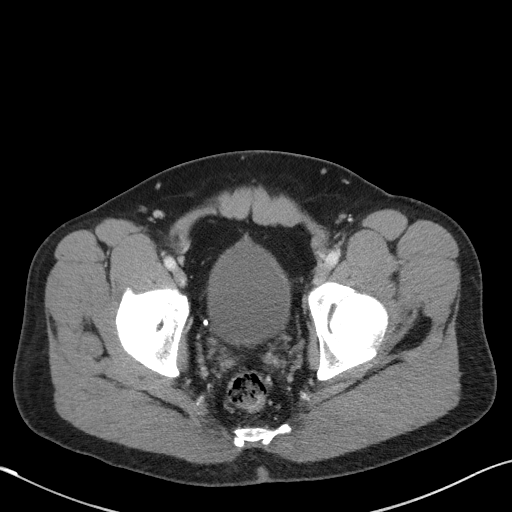
[im 24/90  soft-tissue]
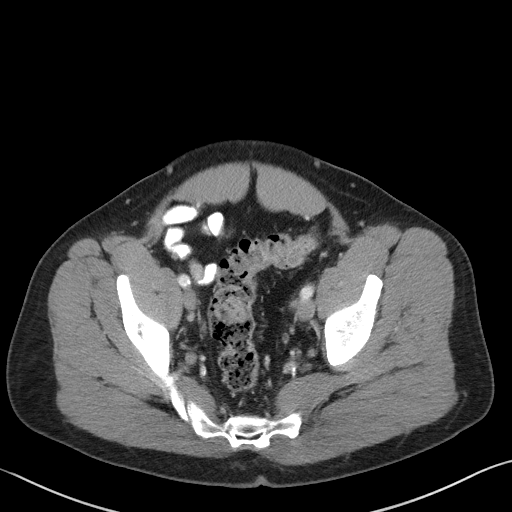
[im 33/90  soft-tissue]
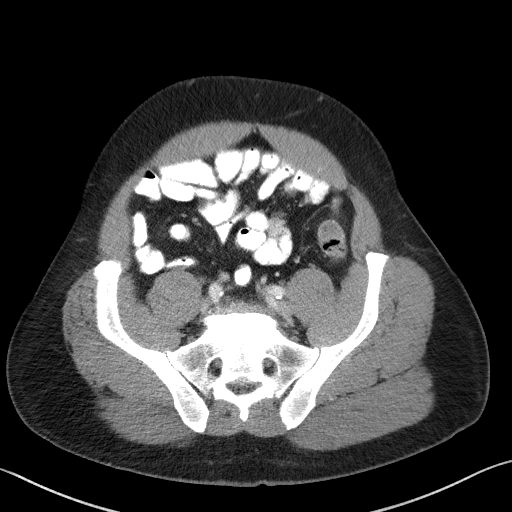
[im 38/90  soft-tissue]
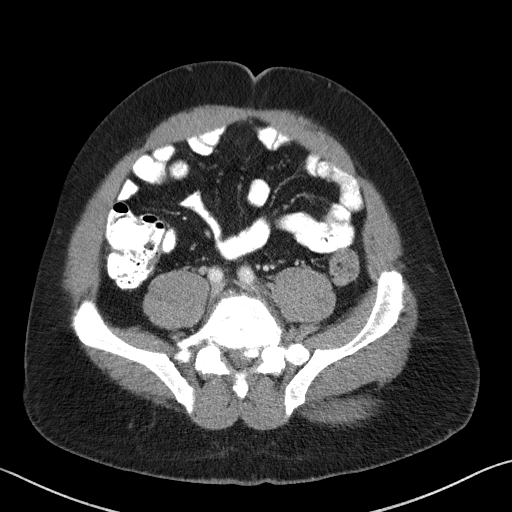
[im 47/90  soft-tissue]
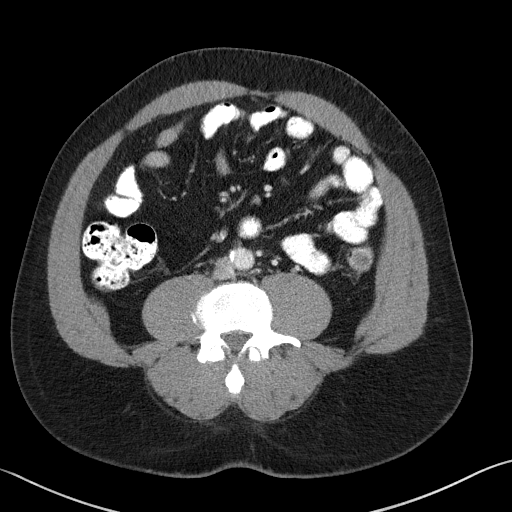
[im 52/90  soft-tissue]
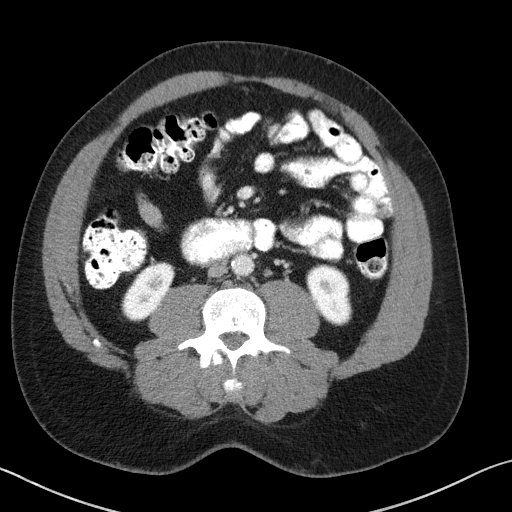
[im 57/90  soft-tissue]
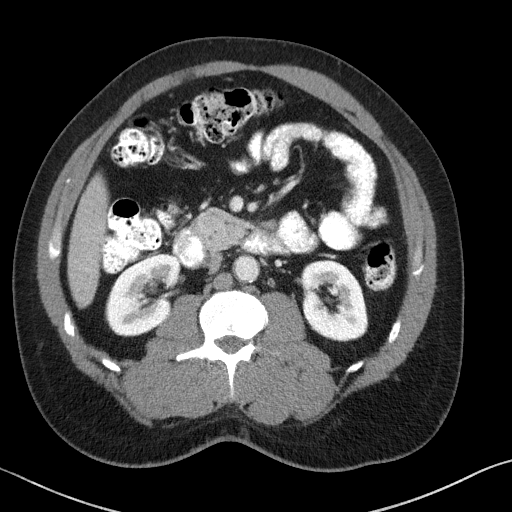
[im 57/90  bone]
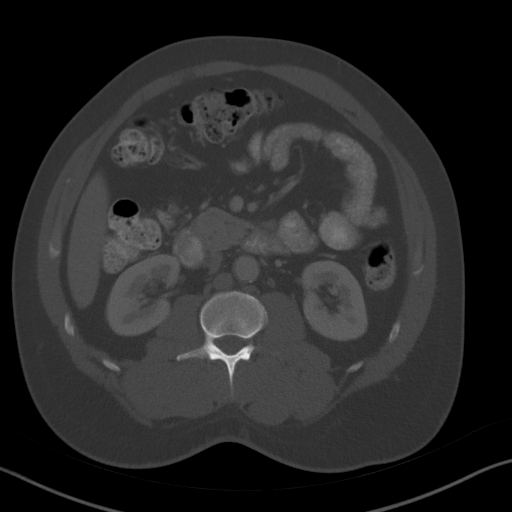
[im 66/90  soft-tissue]
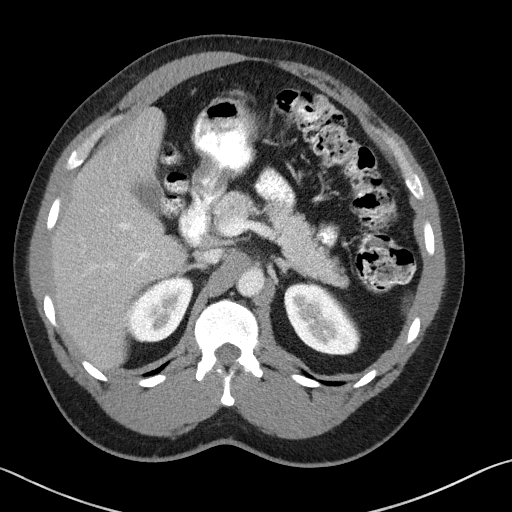
[im 71/90  soft-tissue]
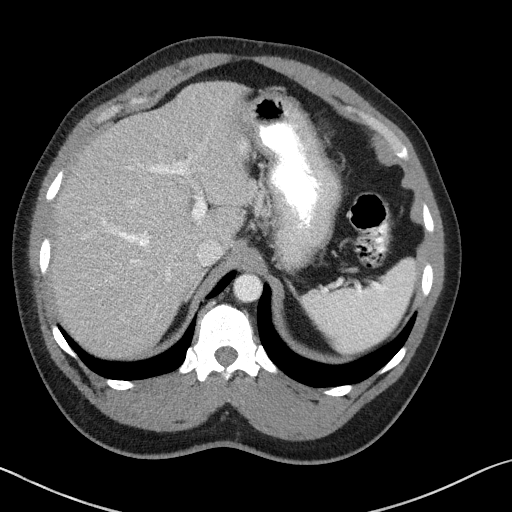
[im 71/90  lung]
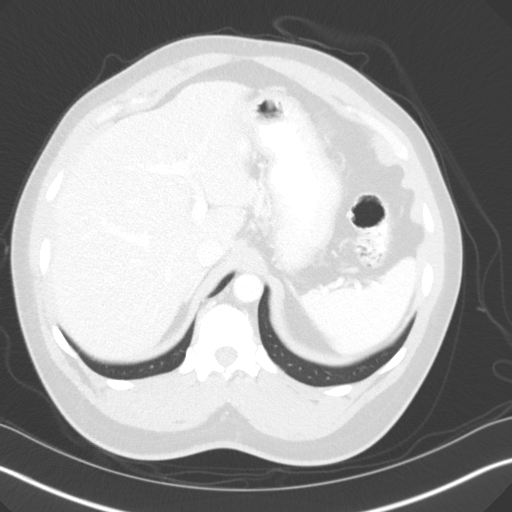
[im 75/90  soft-tissue]
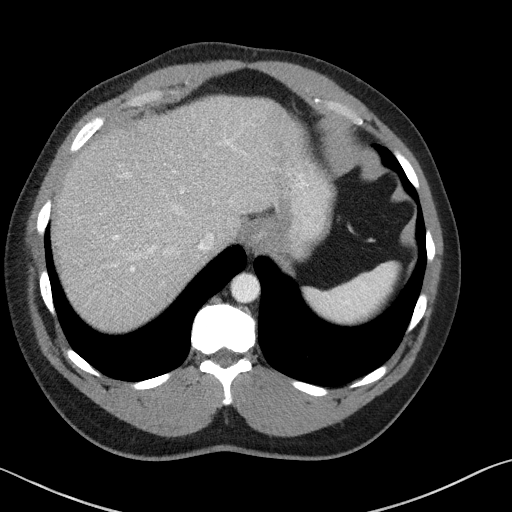
[im 75/90  lung]
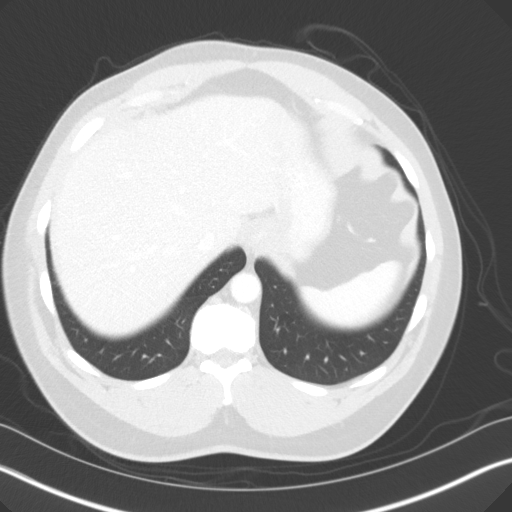
[im 80/90  lung]
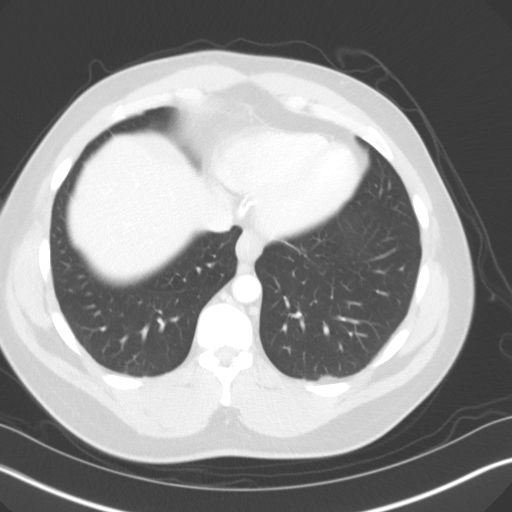
[im 85/90  soft-tissue]
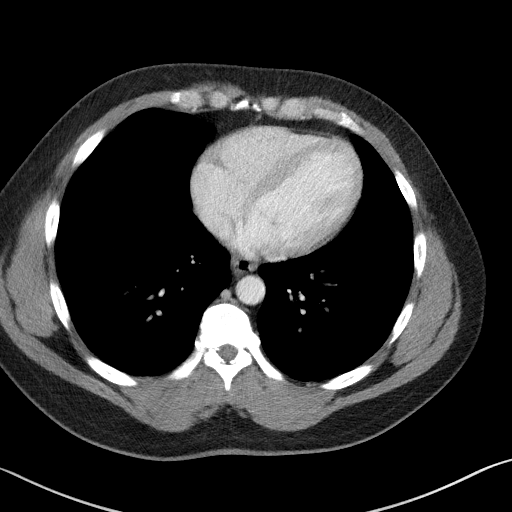
[im 85/90  lung]
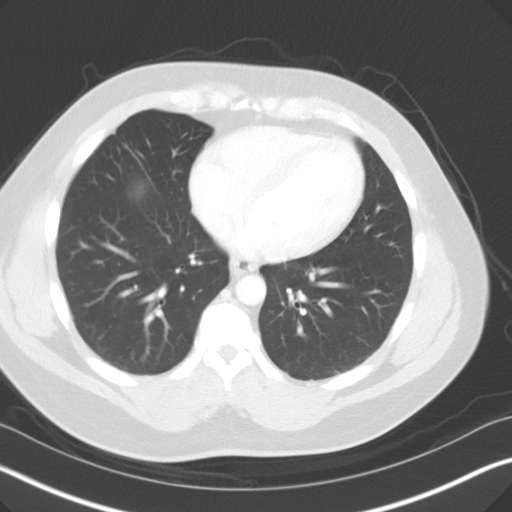

[14 of 32 positions shown; findings below may reference images not displayed]

FINDINGS: Lower chest: Lung bases are clear. No effusions. Heart is normal
size.

Hepatobiliary: Mild diffuse fatty infiltration of the liver.
Gallbladder unremarkable.

Pancreas: Normal appearance

Spleen: Normal appearance

Adrenals/Urinary Tract: No adrenal abnormality. No focal renal
abnormality. No stones or hydronephrosis. Urinary bladder is
unremarkable.

Stomach/Bowel: Appendix is normal. Stomach, large and small bowel
grossly unremarkable.

Vascular/Lymphatic: Scattered aortic and iliac calcifications. No
aneurysm. No adenopathy.

Reproductive: No visible abnormality

Other: No free fluid or free air.  No inguinal hernia.

Musculoskeletal: No acute bony abnormality or focal bone lesion.
Degenerative changes in the lower lumbar spine.
IMPRESSION: No acute findings in the abdomen or pelvis.

Mild fatty infiltration of the liver.

Aorta iliac atherosclerosis.

## 2017-10-13 DIAGNOSIS — E785 Hyperlipidemia, unspecified: Secondary | ICD-10-CM | POA: Diagnosis not present

## 2017-10-13 DIAGNOSIS — E119 Type 2 diabetes mellitus without complications: Secondary | ICD-10-CM | POA: Diagnosis not present

## 2017-11-25 DIAGNOSIS — E559 Vitamin D deficiency, unspecified: Secondary | ICD-10-CM | POA: Diagnosis not present

## 2017-11-25 DIAGNOSIS — T7840XD Allergy, unspecified, subsequent encounter: Secondary | ICD-10-CM | POA: Diagnosis not present

## 2017-11-25 DIAGNOSIS — I10 Essential (primary) hypertension: Secondary | ICD-10-CM | POA: Diagnosis not present

## 2017-11-25 DIAGNOSIS — M545 Low back pain: Secondary | ICD-10-CM | POA: Diagnosis not present

## 2017-11-25 DIAGNOSIS — E119 Type 2 diabetes mellitus without complications: Secondary | ICD-10-CM | POA: Diagnosis not present

## 2017-11-25 DIAGNOSIS — E785 Hyperlipidemia, unspecified: Secondary | ICD-10-CM | POA: Diagnosis not present

## 2017-11-25 DIAGNOSIS — E039 Hypothyroidism, unspecified: Secondary | ICD-10-CM | POA: Diagnosis not present

## 2017-11-25 DIAGNOSIS — Z0000 Encounter for general adult medical examination without abnormal findings: Secondary | ICD-10-CM | POA: Diagnosis not present

## 2017-12-03 DIAGNOSIS — M545 Low back pain: Secondary | ICD-10-CM | POA: Diagnosis not present

## 2017-12-03 DIAGNOSIS — E039 Hypothyroidism, unspecified: Secondary | ICD-10-CM | POA: Diagnosis not present

## 2017-12-03 DIAGNOSIS — E119 Type 2 diabetes mellitus without complications: Secondary | ICD-10-CM | POA: Diagnosis not present

## 2017-12-03 DIAGNOSIS — E559 Vitamin D deficiency, unspecified: Secondary | ICD-10-CM | POA: Diagnosis not present

## 2017-12-03 DIAGNOSIS — E785 Hyperlipidemia, unspecified: Secondary | ICD-10-CM | POA: Diagnosis not present

## 2017-12-03 DIAGNOSIS — I10 Essential (primary) hypertension: Secondary | ICD-10-CM | POA: Diagnosis not present

## 2018-03-07 DIAGNOSIS — R300 Dysuria: Secondary | ICD-10-CM | POA: Diagnosis not present

## 2018-03-07 DIAGNOSIS — R739 Hyperglycemia, unspecified: Secondary | ICD-10-CM | POA: Diagnosis not present

## 2018-03-07 DIAGNOSIS — M1220 Villonodular synovitis (pigmented), unspecified site: Secondary | ICD-10-CM | POA: Diagnosis not present

## 2018-03-07 DIAGNOSIS — M129 Arthropathy, unspecified: Secondary | ICD-10-CM | POA: Diagnosis not present

## 2018-03-07 DIAGNOSIS — E039 Hypothyroidism, unspecified: Secondary | ICD-10-CM | POA: Diagnosis not present

## 2018-03-07 DIAGNOSIS — E785 Hyperlipidemia, unspecified: Secondary | ICD-10-CM | POA: Diagnosis not present

## 2018-03-07 DIAGNOSIS — E559 Vitamin D deficiency, unspecified: Secondary | ICD-10-CM | POA: Diagnosis not present

## 2018-03-28 DIAGNOSIS — H5203 Hypermetropia, bilateral: Secondary | ICD-10-CM | POA: Diagnosis not present

## 2018-03-28 DIAGNOSIS — H524 Presbyopia: Secondary | ICD-10-CM | POA: Diagnosis not present

## 2018-03-28 DIAGNOSIS — H52223 Regular astigmatism, bilateral: Secondary | ICD-10-CM | POA: Diagnosis not present

## 2018-06-13 DIAGNOSIS — T7840XD Allergy, unspecified, subsequent encounter: Secondary | ICD-10-CM | POA: Diagnosis not present

## 2018-06-13 DIAGNOSIS — E785 Hyperlipidemia, unspecified: Secondary | ICD-10-CM | POA: Diagnosis not present

## 2018-06-13 DIAGNOSIS — M25562 Pain in left knee: Secondary | ICD-10-CM | POA: Diagnosis not present

## 2018-06-13 DIAGNOSIS — I10 Essential (primary) hypertension: Secondary | ICD-10-CM | POA: Diagnosis not present

## 2018-06-13 DIAGNOSIS — E119 Type 2 diabetes mellitus without complications: Secondary | ICD-10-CM | POA: Diagnosis not present

## 2018-06-13 DIAGNOSIS — E039 Hypothyroidism, unspecified: Secondary | ICD-10-CM | POA: Diagnosis not present

## 2018-06-14 ENCOUNTER — Ambulatory Visit
Admission: RE | Admit: 2018-06-14 | Discharge: 2018-06-14 | Disposition: A | Discharge status: Home / Self Care | Payer: 59 | Source: Ambulatory Visit | Attending: *Deleted | Admitting: *Deleted

## 2018-06-14 DIAGNOSIS — T7840XD Allergy, unspecified, subsequent encounter: Secondary | ICD-10-CM | POA: Diagnosis not present

## 2018-06-14 DIAGNOSIS — M25462 Effusion, left knee: Secondary | ICD-10-CM | POA: Diagnosis not present

## 2018-06-14 DIAGNOSIS — E119 Type 2 diabetes mellitus without complications: Secondary | ICD-10-CM | POA: Diagnosis not present

## 2018-06-14 DIAGNOSIS — E785 Hyperlipidemia, unspecified: Secondary | ICD-10-CM | POA: Diagnosis not present

## 2018-06-14 DIAGNOSIS — M25562 Pain in left knee: Secondary | ICD-10-CM | POA: Diagnosis not present

## 2018-06-14 DIAGNOSIS — I10 Essential (primary) hypertension: Secondary | ICD-10-CM | POA: Diagnosis not present

## 2018-06-14 DIAGNOSIS — M1712 Unilateral primary osteoarthritis, left knee: Secondary | ICD-10-CM | POA: Diagnosis not present

## 2018-06-14 DIAGNOSIS — E039 Hypothyroidism, unspecified: Secondary | ICD-10-CM | POA: Diagnosis not present

## 2018-07-04 DIAGNOSIS — E039 Hypothyroidism, unspecified: Secondary | ICD-10-CM | POA: Diagnosis not present

## 2018-07-04 DIAGNOSIS — E785 Hyperlipidemia, unspecified: Secondary | ICD-10-CM | POA: Diagnosis not present

## 2018-07-04 DIAGNOSIS — M25562 Pain in left knee: Secondary | ICD-10-CM | POA: Diagnosis not present

## 2018-10-06 ENCOUNTER — Telehealth: Payer: Self-pay | Admitting: Family Medicine

## 2018-10-06 NOTE — Telephone Encounter (Signed)
Pt's wife Sailors called  stated her brother n law Marjo Bicker saw you today and was told for [place] to send you a message and you would see this pt as a new pt.  Please advise.

## 2018-10-06 NOTE — Telephone Encounter (Signed)
I see BIL.  Ok to schedule as new patient in open 30 min slot. Thanks.

## 2018-10-07 NOTE — Telephone Encounter (Signed)
Appointment 1/27 Pt aware

## 2018-10-31 ENCOUNTER — Encounter: Payer: Self-pay | Admitting: Family Medicine

## 2018-10-31 ENCOUNTER — Ambulatory Visit (INDEPENDENT_AMBULATORY_CARE_PROVIDER_SITE_OTHER): Payer: No Typology Code available for payment source | Admitting: Family Medicine

## 2018-10-31 VITALS — BP 140/82 | HR 66 | Temp 98.6°F | Ht 67.5 in | Wt 187.5 lb

## 2018-10-31 DIAGNOSIS — E039 Hypothyroidism, unspecified: Secondary | ICD-10-CM | POA: Diagnosis not present

## 2018-10-31 DIAGNOSIS — E1169 Type 2 diabetes mellitus with other specified complication: Secondary | ICD-10-CM | POA: Insufficient documentation

## 2018-10-31 DIAGNOSIS — I10 Essential (primary) hypertension: Secondary | ICD-10-CM | POA: Diagnosis not present

## 2018-10-31 DIAGNOSIS — E785 Hyperlipidemia, unspecified: Secondary | ICD-10-CM

## 2018-10-31 LAB — LIPID PANEL
Cholesterol: 187 mg/dL (ref 0–200)
HDL: 31 mg/dL — ABNORMAL LOW (ref >39.00)
LDL Cholesterol: 135 mg/dL — ABNORMAL HIGH (ref 0–99)
NonHDL: 155.87
Total CHOL/HDL Ratio: 6
Triglycerides: 104 mg/dL (ref 0.0–149.0)
VLDL: 20.8 mg/dL (ref 0.0–40.0)

## 2018-10-31 LAB — COMPREHENSIVE METABOLIC PANEL
ALBUMIN: 4.7 g/dL (ref 3.5–5.2)
ALT: 28 U/L (ref 0–53)
AST: 27 U/L (ref 0–37)
Alkaline Phosphatase: 43 U/L (ref 39–117)
BUN: 12 mg/dL (ref 6–23)
CO2: 29 mEq/L (ref 19–32)
Calcium: 9.9 mg/dL (ref 8.4–10.5)
Chloride: 103 mEq/L (ref 96–112)
Creatinine, Ser: 0.99 mg/dL (ref 0.40–1.50)
GFR: 95.52 mL/min (ref >60.00)
Glucose, Bld: 139 mg/dL — ABNORMAL HIGH (ref 70–99)
Potassium: 4.5 mEq/L (ref 3.5–5.1)
SODIUM: 139 meq/L (ref 135–145)
Total Bilirubin: 0.4 mg/dL (ref 0.2–1.2)
Total Protein: 7.8 g/dL (ref 6.0–8.3)

## 2018-10-31 LAB — TSH: TSH: 8.29 u[IU]/mL — ABNORMAL HIGH (ref 0.35–4.50)

## 2018-10-31 LAB — HEMOGLOBIN A1C: Hgb A1c MFr Bld: 7.2 % — ABNORMAL HIGH (ref 4.6–6.5)

## 2018-10-31 MED ORDER — LOVASTATIN 20 MG PO TABS: 20.0000 mg | ORAL_TABLET | Freq: Every day | ORAL | 3 refills | Status: DC

## 2018-10-31 MED ORDER — LEVOTHYROXINE SODIUM 112 MCG PO TABS: 112.0000 ug | ORAL_TABLET | Freq: Every day | ORAL | 3 refills | Status: DC

## 2018-10-31 MED ORDER — LISINOPRIL 5 MG PO TABS: 5.0000 mg | ORAL_TABLET | Freq: Every day | ORAL | 3 refills | Status: DC

## 2018-10-31 MED ORDER — FISH OIL 1000 MG PO CAPS: 1.0000 | ORAL_CAPSULE | Freq: Every day | ORAL | 0 refills | Status: DC

## 2018-10-31 NOTE — Assessment & Plan Note (Signed)
Chronic, stable. Continue current regimen. Update FLP today.

## 2018-10-31 NOTE — Assessment & Plan Note (Addendum)
Chronic, seems stable off medication. Update A1c today. Dx 2011

## 2018-10-31 NOTE — Assessment & Plan Note (Signed)
Chronic, stable. Continue current regimen. Takes ACEI QOD

## 2018-10-31 NOTE — Progress Notes (Signed)
BP 140/82 (BP Location: Left Arm, Patient Position: Sitting, Cuff Size: Normal)   Pulse 66   Temp 98.6 F (37 C) (Oral)   Ht 5' 7.5" (1.715 m)   Wt 187 lb 8 oz (85 kg)   SpO2 96%   BMI 28.93 kg/m    CC: new pt to establish care Subjective:    Patient ID: Jonathan Thomas, male    DOB: July 23, 1966, 53 y.o.   MRN: [id number]  HPI: Jonathan Thomas is a 53 y.o. male presenting on 10/31/2018 for New Patient (Initial Visit)   Prior saw Dr Rosario Jacks in [place] since 2012 - he was out of network after insurance change. BIL of another patient of mine Marjo Bicker).   HLD - on lipitor 10mg  but finds it causes muscle cramps if taken daily.   Hypothyroid since 2010 - compliant with medication. No hypo or hyper thyroid symptoms.   HTN - Compliant with current antihypertensive regimen of lisinopril 5mg  QOD. BP borderline today - had sausage yesterday. Does not check blood pressures at home.  No low blood pressure readings or symptoms of dizziness/syncope.  Denies HA, vision changes, CP/tightness, SOB, leg swelling.    DM 2011 - stopped all medications 2013. Previously on insulin and oral antihyperglycemics. Still checks sugars intermittently. Glucose meter - unsure brand. Last A1c 6.7%. due for recheck.   Last CPE about 6 mo ago.   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 Interim medical history since our last visit reviewed. Allergies and medications reviewed and updated. Outpatient Medications Prior to Visit  Medication Sig Dispense Refill  . atorvastatin (LIPITOR) 10 MG tablet Take 10 mg by mouth daily.    Marland Kitchen levothyroxine (SYNTHROID, LEVOTHROID) 112 MCG tablet Take 112 mcg by mouth daily before breakfast.    .  lisinopril (PRINIVIL,ZESTRIL) 5 MG tablet Take 5 mg by mouth daily.    . Omega-3 Fatty Acids (FISH OIL PO) Take by mouth daily.    . cephALEXin (KEFLEX) 500 MG capsule Take 1 capsule (500 mg total) by mouth 3 (three) times daily. 30 capsule 2  . levothyroxine (SYNTHROID, LEVOTHROID) 88 MCG tablet Take 88 mcg by mouth daily before breakfast.    . Multiple Vitamin (MULTIVITAMIN) capsule Take 1 capsule by mouth daily.     No facility-administered medications prior to visit.      Per HPI unless specifically indicated in ROS section below Review of Systems Objective:    BP 140/82 (BP Location: Left Arm, Patient Position: Sitting, Cuff Size: Normal)   Pulse 66   Temp 98.6 F (37 C) (Oral)   Ht 5' 7.5" (1.715 m)   Wt 187 lb 8 oz (85 kg)   SpO2 96%   BMI 28.93 kg/m   Wt Readings from Last 3 Encounters:  10/31/18 187 lb 8 oz (85 kg)  03/10/16 178 lb (80.7 kg)  02/19/16 175 lb (79.4 kg)    Physical Exam Vitals signs and nursing note reviewed.  Constitutional:      General: He is not in acute distress.    Appearance: Normal appearance. He is well-developed.  HENT:     Head: Normocephalic and atraumatic.     Mouth/Throat:     Mouth: Mucous membranes are moist.  Pharynx: Oropharynx is clear. No oropharyngeal exudate.  Eyes:     General: No scleral icterus.    Conjunctiva/sclera: Conjunctivae normal.     Pupils: Pupils are equal, round, and reactive to light.  Neck:     Musculoskeletal: Normal range of motion and neck supple.  Cardiovascular:     Rate and Rhythm: Normal rate and regular rhythm.     Pulses: Normal pulses.     Heart sounds: Normal heart sounds. No murmur.  Pulmonary:     Effort: Pulmonary effort is normal. No respiratory distress.     Breath sounds: Normal breath sounds. No wheezing or rales.  Musculoskeletal:     Comments: See HPI for foot exam if done 2+ DP bilaterally  Lymphadenopathy:     Cervical: No cervical adenopathy.  Skin:    General: Skin is warm  and dry.     Findings: No rash.  Neurological:     Mental Status: He is alert.       No results found for this or any previous visit. Assessment & Plan:  Meds refilled today.  Problem List Items Addressed This Visit    Hypothyroidism    Chronic, stable. Update TSH.       Relevant Medications   levothyroxine (SYNTHROID, LEVOTHROID) 112 MCG tablet   Other Relevant Orders   TSH   Hyperlipidemia    Chronic, stable. Continue current regimen. Update FLP today.       Relevant Medications   lisinopril (PRINIVIL,ZESTRIL) 5 MG tablet   lovastatin (MEVACOR) 20 MG tablet   Other Relevant Orders   Lipid panel   Comprehensive metabolic panel   Essential hypertension    Chronic, stable. Continue current regimen. Takes ACEI QOD      Relevant Medications   lisinopril (PRINIVIL,ZESTRIL) 5 MG tablet   lovastatin (MEVACOR) 20 MG tablet   Diabetes mellitus ([facility]) - Primary    Chronic, seems stable off medication. Update A1c today. Dx 2011      Relevant Medications   lisinopril (PRINIVIL,ZESTRIL) 5 MG tablet   lovastatin (MEVACOR) 20 MG tablet   Other Relevant Orders   Hemoglobin A1c       Meds ordered this encounter  Medications  . DISCONTD: lisinopril (PRINIVIL,ZESTRIL) 5 MG tablet    Sig: Take 1 tablet (5 mg total) by mouth daily.    Dispense:  45 tablet    Refill:  3  . Omega-3 Fatty Acids (FISH OIL) 1000 MG CAPS    Sig: Take 1 capsule (1,000 mg total) by mouth daily.    Refill:  0  . DISCONTD: levothyroxine (SYNTHROID, LEVOTHROID) 112 MCG tablet    Sig: Take 1 tablet (112 mcg total) by mouth daily before breakfast.    Dispense:  90 tablet    Refill:  3  . DISCONTD: lovastatin (MEVACOR) 20 MG tablet    Sig: Take 1 tablet (20 mg total) by mouth at bedtime.    Dispense:  90 tablet    Refill:  3  . levothyroxine (SYNTHROID, LEVOTHROID) 112 MCG tablet    Sig: Take 1 tablet (112 mcg total) by mouth daily before breakfast.    Dispense:  90 tablet    Refill:  3  .  lisinopril (PRINIVIL,ZESTRIL) 5 MG tablet    Sig: Take 1 tablet (5 mg total) by mouth daily.    Dispense:  45 tablet    Refill:  3  . lovastatin (MEVACOR) 20 MG tablet    Sig: Take 1 tablet (20 mg total)  by mouth at bedtime.    Dispense:  90 tablet    Refill:  3   Orders Placed This Encounter  Procedures  . Lipid panel  . Comprehensive metabolic panel  . TSH  . Hemoglobin A1c    Follow up plan: Return in about 6 months (around 05/01/2019) for annual exam, prior fasting for blood work.  Ria Bush, MD

## 2018-10-31 NOTE — Assessment & Plan Note (Signed)
Chronic, stable. Update TSH.

## 2018-10-31 NOTE — Patient Instructions (Addendum)
Sign release for records up front for colonoscopy pathology report (Dr Allen Norris).  Labs today.  You are doing well today. Return in 6 months for physical, sooner if needed.

## 2018-11-05 ENCOUNTER — Other Ambulatory Visit: Payer: Self-pay | Admitting: Family Medicine

## 2018-11-05 MED ORDER — METFORMIN HCL 500 MG PO TABS: 500.0000 mg | ORAL_TABLET | Freq: Every day | ORAL | 1 refills | Status: DC

## 2018-11-07 ENCOUNTER — Telehealth: Payer: Self-pay

## 2018-11-07 NOTE — Telephone Encounter (Addendum)
Left message on vm per dpr relaying results, instructions and message per Dr. Darnell Level.  Asked pt to call back to schedule 3 mo DM f/u OV.   Results Results released via MyChart. Sugars and cholesterol levels are elevated.  I recommend you start metformin 500mg  daily with breakfast, which has been sent to the pharmacy.  Return in 3 months for DM f/u visit.

## 2018-11-08 NOTE — Telephone Encounter (Signed)
Left message on vm per dpr relaying results, instructions and message per Dr. Darnell Level.  Asked pt to call back to schedule 3 mo DM f/u OV. Mailing a letter.

## 2019-03-31 LAB — HM DIABETES EYE EXAM

## 2019-04-10 ENCOUNTER — Other Ambulatory Visit: Payer: Self-pay

## 2019-04-10 ENCOUNTER — Encounter: Payer: Self-pay | Admitting: Family Medicine

## 2019-04-10 ENCOUNTER — Other Ambulatory Visit: Payer: Self-pay | Admitting: Family Medicine

## 2019-04-10 ENCOUNTER — Other Ambulatory Visit (INDEPENDENT_AMBULATORY_CARE_PROVIDER_SITE_OTHER): Payer: No Typology Code available for payment source

## 2019-04-10 DIAGNOSIS — E785 Hyperlipidemia, unspecified: Secondary | ICD-10-CM

## 2019-04-10 DIAGNOSIS — E1169 Type 2 diabetes mellitus with other specified complication: Secondary | ICD-10-CM

## 2019-04-10 DIAGNOSIS — Z125 Encounter for screening for malignant neoplasm of prostate: Secondary | ICD-10-CM

## 2019-04-10 DIAGNOSIS — E039 Hypothyroidism, unspecified: Secondary | ICD-10-CM

## 2019-04-10 LAB — LIPID PANEL
Cholesterol: 171 mg/dL (ref 0–200)
HDL: 29.1 mg/dL — ABNORMAL LOW (ref >39.00)
LDL Cholesterol: 117 mg/dL — ABNORMAL HIGH (ref 0–99)
NonHDL: 141.63
Total CHOL/HDL Ratio: 6
Triglycerides: 122 mg/dL (ref 0.0–149.0)
VLDL: 24.4 mg/dL (ref 0.0–40.0)

## 2019-04-10 LAB — PSA: PSA: 1.43 ng/mL (ref 0.10–4.00)

## 2019-04-10 LAB — COMPREHENSIVE METABOLIC PANEL
ALT: 22 U/L (ref 0–53)
AST: 19 U/L (ref 0–37)
Albumin: 4.6 g/dL (ref 3.5–5.2)
Alkaline Phosphatase: 44 U/L (ref 39–117)
BUN: 16 mg/dL (ref 6–23)
CO2: 28 mEq/L (ref 19–32)
Calcium: 9.2 mg/dL (ref 8.4–10.5)
Chloride: 104 mEq/L (ref 96–112)
Creatinine, Ser: 1 mg/dL (ref 0.40–1.50)
GFR: 94.26 mL/min (ref >60.00)
Glucose, Bld: 135 mg/dL — ABNORMAL HIGH (ref 70–99)
Potassium: 4.4 mEq/L (ref 3.5–5.1)
Sodium: 138 mEq/L (ref 135–145)
Total Bilirubin: 0.4 mg/dL (ref 0.2–1.2)
Total Protein: 7.3 g/dL (ref 6.0–8.3)

## 2019-04-10 LAB — HEMOGLOBIN A1C: Hgb A1c MFr Bld: 6.6 % — ABNORMAL HIGH (ref 4.6–6.5)

## 2019-04-10 LAB — T4, FREE: Free T4: 0.86 ng/dL (ref 0.60–1.60)

## 2019-04-10 LAB — TSH: TSH: 0.97 u[IU]/mL (ref 0.35–4.50)

## 2019-04-17 ENCOUNTER — Other Ambulatory Visit: Payer: Self-pay

## 2019-04-17 ENCOUNTER — Encounter: Payer: Self-pay | Admitting: Family Medicine

## 2019-04-17 ENCOUNTER — Ambulatory Visit (INDEPENDENT_AMBULATORY_CARE_PROVIDER_SITE_OTHER): Payer: No Typology Code available for payment source | Admitting: Family Medicine

## 2019-04-17 VITALS — BP 148/88 | HR 78 | Temp 98.0°F | Ht 67.75 in | Wt 175.2 lb

## 2019-04-17 DIAGNOSIS — R1031 Right lower quadrant pain: Secondary | ICD-10-CM | POA: Insufficient documentation

## 2019-04-17 DIAGNOSIS — I10 Essential (primary) hypertension: Secondary | ICD-10-CM

## 2019-04-17 DIAGNOSIS — E118 Type 2 diabetes mellitus with unspecified complications: Secondary | ICD-10-CM | POA: Diagnosis not present

## 2019-04-17 DIAGNOSIS — E785 Hyperlipidemia, unspecified: Secondary | ICD-10-CM

## 2019-04-17 DIAGNOSIS — Z0000 Encounter for general adult medical examination without abnormal findings: Secondary | ICD-10-CM | POA: Diagnosis not present

## 2019-04-17 DIAGNOSIS — E039 Hypothyroidism, unspecified: Secondary | ICD-10-CM

## 2019-04-17 MED ORDER — LISINOPRIL 10 MG PO TABS: 10.0000 mg | ORAL_TABLET | Freq: Every day | ORAL | 1 refills | Status: DC

## 2019-04-17 MED ORDER — METFORMIN HCL 500 MG PO TABS: 500.0000 mg | ORAL_TABLET | Freq: Every day | ORAL | 1 refills | Status: DC

## 2019-04-17 NOTE — Assessment & Plan Note (Addendum)
Chronic, LDL above goal. Consider increased statin dose or change to more potent statin. Continue current regimen. Reassess at f/u visit.  The 10-year ASCVD risk score Mikey Bussing DC Brooke Bonito., et al., 2013) is: 25.9%   Values used to calculate the score:     Age: 53 years     Sex: Male     Is Non-Hispanic African American: Yes     Diabetic: Yes     Tobacco smoker: No     Systolic Blood Pressure: 269 mmHg     Is BP treated: Yes     HDL Cholesterol: 29.1 mg/dL     Total Cholesterol: 171 mg/dL

## 2019-04-17 NOTE — Assessment & Plan Note (Addendum)
Chronic, stable. Thyroid functions at goal.

## 2019-04-17 NOTE — Progress Notes (Signed)
This visit was conducted in person.  BP (!) 148/88 (BP Location: Right Arm, Patient Position: Sitting, Cuff Size: Normal)   Pulse 78   Temp 98 F (36.7 C) (Temporal)   Ht 5' 7.75" (1.721 m)   Wt 175 lb 4 oz (79.5 kg)   SpO2 99%   BMI 26.84 kg/m   BP Readings from Last 3 Encounters:  04/17/19 (!) 148/88  10/31/18 140/82  10/29/16 (!) 139/94   CC: CPE Subjective:    Patient ID: Jonathan Thomas, male    DOB: 02/21/1966, 53 y.o.   MRN: [id number]  HPI: Jonathan Thomas is a 53 y.o. male presenting on 04/17/2019 for Annual Exam   Hasn't started metformin yet.  Less active with gyms closed.   Some R lower abd pain without bulging wonders if hernia. 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 Occasional blood in stool in commode and with wiping.   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 Interim medical history since our last visit reviewed. Allergies and medications reviewed and updated. Outpatient Medications Prior to Visit  Medication Sig Dispense Refill  . levothyroxine (SYNTHROID, LEVOTHROID) 112 MCG tablet Take 1 tablet (112 mcg total) by mouth daily before breakfast. 90 tablet 3  . lovastatin (MEVACOR) 20 MG tablet Take 1 tablet (20 mg total) by mouth at bedtime. 90 tablet 3  . Omega-3 Fatty Acids (FISH OIL) 1000 MG CAPS Take 1 capsule (1,000 mg total) by mouth  daily.  0  . lisinopril (PRINIVIL,ZESTRIL) 5 MG tablet Take 1 tablet (5 mg total) by mouth daily. 45 tablet 3  . metFORMIN (GLUCOPHAGE) 500 MG tablet Take 1 tablet (500 mg total) by mouth daily with breakfast. 90 tablet 1   No facility-administered medications prior to visit.      Per HPI unless specifically indicated in ROS section below Review of Systems  Constitutional: Negative for activity change, appetite change, chills, fatigue, fever and unexpected weight change.  HENT: Negative for hearing loss.   Eyes: Negative for visual disturbance.  Respiratory: Negative for cough, chest tightness, shortness of breath and wheezing.   Cardiovascular: Negative for chest pain, palpitations and leg swelling.  Gastrointestinal: Positive for abdominal pain (see HPI) and blood in stool (see HPI). Negative for abdominal distention, constipation, diarrhea, nausea and vomiting.  Genitourinary: Negative for difficulty urinating and hematuria.  Musculoskeletal: Negative for arthralgias, myalgias and neck pain.  Skin: Negative for rash.  Neurological: Negative for dizziness, seizures, syncope and headaches.  Hematological: Negative for adenopathy. Does not bruise/bleed easily.  Psychiatric/Behavioral: Negative for dysphoric mood. The patient is not nervous/anxious.    Objective:    BP (!) 148/88 (BP Location: Right Arm, Patient Position: Sitting, Cuff Size: Normal)   Pulse 78   Temp 98 F (36.7 C) (Temporal)   Ht 5' 7.75" (1.721 m)   Wt 175 lb 4 oz (79.5 kg)   SpO2 99%   BMI 26.84  kg/m   Wt Readings from Last 3 Encounters:  04/17/19 175 lb 4 oz (79.5 kg)  10/31/18 187 lb 8 oz (85 kg)  03/10/16 178 lb (80.7 kg)    Physical Exam Vitals signs and nursing note reviewed.  Constitutional:      General: He is not in acute distress.    Appearance: Normal appearance. He is well-developed. He is not ill-appearing.  HENT:     Head: Normocephalic and atraumatic.     Right Ear: Hearing, tympanic  membrane, ear canal and external ear normal.     Left Ear: Hearing, tympanic membrane, ear canal and external ear normal.     Nose: Nose normal.     Mouth/Throat:     Mouth: Mucous membranes are moist.     Pharynx: Uvula midline. No oropharyngeal exudate or posterior oropharyngeal erythema.  Eyes:     General: No scleral icterus.    Extraocular Movements: Extraocular movements intact.     Conjunctiva/sclera: Conjunctivae normal.     Pupils: Pupils are equal, round, and reactive to light.  Neck:     Musculoskeletal: Normal range of motion and neck supple.  Cardiovascular:     Rate and Rhythm: Normal rate and regular rhythm.     Pulses: Normal pulses.          Radial pulses are 2+ on the right side and 2+ on the left side.     Heart sounds: Normal heart sounds. No murmur.  Pulmonary:     Effort: Pulmonary effort is normal. No respiratory distress.     Breath sounds: Normal breath sounds. No wheezing, rhonchi or rales.  Abdominal:     General: Bowel sounds are normal. There is no distension.     Palpations: Abdomen is soft. There is no mass.     Tenderness: There is no abdominal tenderness. There is no guarding or rebound.     Hernia: A hernia is present. Hernia is present in the right inguinal area (possible small R sided noted with cough, reducible). There is no hernia in the left inguinal area.  Genitourinary:    Prostate: Normal. Not enlarged (15gm), not tender and no nodules present.     Rectum: Internal hemorrhoid present. No mass, tenderness, anal fissure or external hemorrhoid. Normal anal tone.  Musculoskeletal: Normal range of motion.  Lymphadenopathy:     Cervical: No cervical adenopathy.  Skin:    General: Skin is warm and dry.     Findings: No rash.  Neurological:     General: No focal deficit present.     Mental Status: He is alert and oriented to person, place, and time.     Comments: CN grossly intact, station and gait intact  Psychiatric:        Mood and Affect:  Mood normal.        Behavior: Behavior normal.        Thought Content: Thought content normal.        Judgment: Judgment normal.       Results for orders placed or performed in visit on 04/10/19  [facility] DIABETES EYE EXAM  Result Value Ref Range   HM Diabetic Eye Exam No Retinopathy No Retinopathy   Assessment & Plan:   Problem List Items Addressed This Visit    Right lower quadrant abdominal pain    Intermittent, noted with straining or heavy lifting. Exam consistent with possible small R sided inguinal hernia. Pt will let us know when he desires gen surg eval -  possibly at end of summer.       Hypothyroidism    Chronic, stable. Thyroid functions at goal.       Hyperlipidemia    Chronic, LDL above goal. Consider increased statin dose or change to more potent statin. Continue current regimen. Reassess at f/u visit.  The 10-year ASCVD risk score Mikey Bussing DC Brooke Bonito., et al., 2013) is: 25.9%   Values used to calculate the score:     Age: 68 years     Sex: Male     Is Non-Hispanic African American: Yes     Diabetic: Yes     Tobacco smoker: No     Systolic Blood Pressure: 470 mmHg     Is BP treated: Yes     HDL Cholesterol: 29.1 mg/dL     Total Cholesterol: 171 mg/dL       Relevant Medications   lisinopril (ZESTRIL) 10 MG tablet   Health maintenance examination - Primary    Preventative protocols reviewed and updated unless pt declined. Discussed healthy diet and lifestyle.       Essential hypertension    Chronic, elevated. Increase lisinopril to 10mg  daily. New dose sent to pharmacy.       Relevant Medications   lisinopril (ZESTRIL) 10 MG tablet   Controlled diabetes mellitus type 2 with complications ([facility])    Chronic, diet controlled. Encouraged he start metformin 500mg  daily.       Relevant Medications   metFORMIN (GLUCOPHAGE) 500 MG tablet   lisinopril (ZESTRIL) 10 MG tablet       Meds ordered this encounter  Medications  . metFORMIN (GLUCOPHAGE) 500 MG tablet     Sig: Take 1 tablet (500 mg total) by mouth daily with breakfast.    Dispense:  90 tablet    Refill:  1  . lisinopril (ZESTRIL) 10 MG tablet    Sig: Take 1 tablet (10 mg total) by mouth daily.    Dispense:  90 tablet    Refill:  1   No orders of the defined types were placed in this encounter.   Follow up plan: Return in about 6 months (around 10/18/2019) for follow up visit.  Ria Bush, MD

## 2019-04-17 NOTE — Assessment & Plan Note (Signed)
Preventative protocols reviewed and updated unless pt declined. Discussed healthy diet and lifestyle.  

## 2019-04-17 NOTE — Assessment & Plan Note (Addendum)
Intermittent, noted with straining or heavy lifting. Exam consistent with possible small R sided inguinal hernia. Pt will let us know when he desires gen surg eval - possibly at end of summer.

## 2019-04-17 NOTE — Assessment & Plan Note (Addendum)
Chronic, diet controlled. Encouraged he start metformin 500mg  daily.

## 2019-04-17 NOTE — Assessment & Plan Note (Signed)
Chronic, elevated. Increase lisinopril to 10mg  daily. New dose sent to pharmacy.

## 2019-04-17 NOTE — Patient Instructions (Addendum)
Sign release of information form for pathology report from polyps removed on colonoscopy.  Work on walking routine. Increase lisinopril to 10mg  daily.  Possible R inguinal hernia - call me when ready for general surgery evaluation.   Health Maintenance, Male Adopting a healthy lifestyle and getting preventive care are important in promoting health and wellness. Ask your health care provider about:  The right schedule for you to have regular tests and exams.  Things you can do on your own to prevent diseases and keep yourself healthy. What should I know about diet, weight, and exercise? Eat a healthy diet   Eat a diet that includes plenty of vegetables, fruits, low-fat dairy products, and lean protein.  Do not eat a lot of foods that are high in solid fats, added sugars, or sodium. Maintain a healthy weight Body mass index (BMI) is a measurement that can be used to identify possible weight problems. It estimates body fat based on height and weight. Your health care provider can help determine your BMI and help you achieve or maintain a healthy weight. Get regular exercise Get regular exercise. This is one of the most important things you can do for your health. Most adults should:  Exercise for at least 150 minutes each week. The exercise should increase your heart rate and make you sweat (moderate-intensity exercise).  Do strengthening exercises at least twice a week. This is in addition to the moderate-intensity exercise.  Spend less time sitting. Even light physical activity can be beneficial. Watch cholesterol and blood lipids Have your blood tested for lipids and cholesterol at 54 years of age, then have this test every 5 years. You may need to have your cholesterol levels checked more often if:  Your lipid or cholesterol levels are high.  You are older than 54 years of age.  You are at high risk for heart disease. What should I know about cancer screening? Many types of  cancers can be detected early and may often be prevented. Depending on your health history and family history, you may need to have cancer screening at various ages. This may include screening for:  Colorectal cancer.  Prostate cancer.  Skin cancer.  Lung cancer. What should I know about heart disease, diabetes, and high blood pressure? Blood pressure and heart disease  High blood pressure causes heart disease and increases the risk of stroke. This is more likely to develop in people who have high blood pressure readings, are of African descent, or are overweight.  Talk with your health care provider about your target blood pressure readings.  Have your blood pressure checked: ? Every 3-5 years if you are 41-49 years of age. ? Every year if you are 59 years old or older.  If you are between the ages of 23 and 25 and are a current or former smoker, ask your health care provider if you should have a one-time screening for abdominal aortic aneurysm (AAA). Diabetes Have regular diabetes screenings. This checks your fasting blood sugar level. Have the screening done:  Once every three years after age 86 if you are at a normal weight and have a low risk for diabetes.  More often and at a younger age if you are overweight or have a high risk for diabetes. What should I know about preventing infection? Hepatitis B If you have a higher risk for hepatitis B, you should be screened for this virus. Talk with your health care provider to find out if you are at  risk for hepatitis B infection. Hepatitis C Blood testing is recommended for:  Everyone born from [birth year] through 1966.  Anyone with known risk factors for hepatitis C. Sexually transmitted infections (STIs)  You should be screened each year for STIs, including gonorrhea and chlamydia, if: ? You are sexually active and are younger than 54 years of age. ? You are older than 54 years of age and your health care provider tells you that you  are at risk for this type of infection. ? Your sexual activity has changed since you were last screened, and you are at increased risk for chlamydia or gonorrhea. Ask your health care provider if you are at risk.  Ask your health care provider about whether you are at high risk for HIV. Your health care provider may recommend a prescription medicine to help prevent HIV infection. If you choose to take medicine to prevent HIV, you should first get tested for HIV. You should then be tested every 3 months for as long as you are taking the medicine. Follow these instructions at home: Lifestyle  Do not use any products that contain nicotine or tobacco, such as cigarettes, e-cigarettes, and chewing tobacco. If you need help quitting, ask your health care provider.  Do not use street drugs.  Do not share needles.  Ask your health care provider for help if you need support or information about quitting drugs. Alcohol use  Do not drink alcohol if your health care provider tells you not to drink.  If you drink alcohol: ? Limit how much you have to 0-2 drinks a day. ? Be aware of how much alcohol is in your drink. In the [place] one drink equals one 12 oz bottle of beer (355 mL), one 5 oz glass of wine (148 mL), or one 1 oz glass of hard liquor (44 mL). General instructions  Schedule regular health, dental, and eye exams.  Stay current with your vaccines.  Tell your health care provider if: ? You often feel depressed. ? You have ever been abused or do not feel safe at home. Summary  Adopting a healthy lifestyle and getting preventive care are important in promoting health and wellness.  Follow your health care provider's instructions about healthy diet, exercising, and getting tested or screened for diseases.  Follow your health care provider's instructions on monitoring your cholesterol and blood pressure. This information is not intended to replace advice given to you by your health care  provider. Make sure you discuss any questions you have with your health care provider. Document Released: 03/19/2008 Document Revised: 09/14/2018 Document Reviewed: 09/14/2018 Elsevier Patient Education  2020 [employer].

## 2019-04-24 ENCOUNTER — Other Ambulatory Visit: Payer: No Typology Code available for payment source

## 2019-05-01 ENCOUNTER — Encounter: Payer: No Typology Code available for payment source | Admitting: Family Medicine

## 2019-09-26 ENCOUNTER — Other Ambulatory Visit: Payer: No Typology Code available for payment source

## 2019-10-19 ENCOUNTER — Encounter: Payer: Self-pay | Admitting: Family Medicine

## 2019-10-19 ENCOUNTER — Ambulatory Visit (INDEPENDENT_AMBULATORY_CARE_PROVIDER_SITE_OTHER): Payer: No Typology Code available for payment source | Admitting: Family Medicine

## 2019-10-19 ENCOUNTER — Other Ambulatory Visit: Payer: Self-pay

## 2019-10-19 VITALS — BP 146/98 | HR 76 | Temp 97.7°F | Ht 67.75 in | Wt 190.4 lb

## 2019-10-19 DIAGNOSIS — E118 Type 2 diabetes mellitus with unspecified complications: Secondary | ICD-10-CM | POA: Diagnosis not present

## 2019-10-19 DIAGNOSIS — R1031 Right lower quadrant pain: Secondary | ICD-10-CM | POA: Diagnosis not present

## 2019-10-19 DIAGNOSIS — I10 Essential (primary) hypertension: Secondary | ICD-10-CM | POA: Diagnosis not present

## 2019-10-19 LAB — POCT GLYCOSYLATED HEMOGLOBIN (HGB A1C): Hemoglobin A1C: 7.4 % — AB (ref 4.0–5.6)

## 2019-10-19 MED ORDER — METFORMIN HCL 500 MG PO TABS: 500.0000 mg | ORAL_TABLET | Freq: Every day | ORAL | 1 refills | Status: DC

## 2019-10-19 MED ORDER — LISINOPRIL 20 MG PO TABS: 20.0000 mg | ORAL_TABLET | Freq: Every day | ORAL | 1 refills | Status: DC

## 2019-10-19 NOTE — Assessment & Plan Note (Addendum)
Chronic, deterioration noted along with weight gain. Declines pneumovax and DSME. Continue metformin 500mg  daily. He is motivated to renew efforts at healthy diet/lifestyle

## 2019-10-19 NOTE — Patient Instructions (Addendum)
Check on insurance preferred brand for glucose meter.  Look into pneumovax-23 - helps prevent bacterial pneumonia caused by streptococcal pneumonia.  Continue metformin 500mg  daily. Return as needed or in 6 months for physical.  bp is staying elevated - increase lisinopril to 20mg  daily , may take 2 at a time until you run out. New dose will be at pharmacy.  We will refer you to gen surgery for further evaluation of possible right inguinal hernia.

## 2019-10-19 NOTE — Progress Notes (Signed)
This visit was conducted in person.  BP (!) 146/98 (BP Location: Right Arm, Patient Position: Sitting, Cuff Size: Large)   Pulse 76   Temp 97.7 F (36.5 C) (Temporal)   Ht 5' 7.75" (1.721 m)   Wt 190 lb 7 oz (86.4 kg)   SpO2 96%   BMI 29.17 kg/m   150/100 on repeat.  CC: DM f/u visit Subjective:    Patient ID: Jonathan Thomas, male    DOB: 02/09/1966, 54 y.o.   MRN: [id number]  HPI: CONNER HANING is a 54 y.o. male presenting on 10/19/2019 for Diabetes (Here for 6 mo f/u.)   Notes worsening R inguinal discomfort with straining. Ready for gen surgery eval.   Declines flu and pneumonia shots.   DM - does regularly check sugars: 1-2 times a week - 150s recently. Not as active as normal, weight gain noted. Compliant with antihyperglycemic regimen which includes: metformin 500mg  daily - just started 2 wks ago. Denies low sugars or hypoglycemic symptoms. Denies paresthesias. Last diabetic eye exam 03/2019. Pneumovax: DUE. Prevnar: not due. Glucometer brand: one-touch. DSME: has not completed - declines at this time.  Lab Results  Component Value Date   HGBA1C 7.4 (A) 10/19/2019   Diabetic Foot Exam - Simple   Simple Foot Form Diabetic Foot exam was performed with the following findings: Yes 10/19/2019  8:18 AM  Visual Inspection No deformities, no ulcerations, no other skin breakdown bilaterally: Yes Sensation Testing Intact to touch and monofilament testing bilaterally: Yes Pulse Check Posterior Tibialis and Dorsalis pulse intact bilaterally: Yes Comments    No results found for: Derl Barrow       Relevant past medical, surgical, family and social history reviewed and updated as indicated. Interim medical history since our last visit reviewed. Allergies and medications reviewed and updated. Outpatient Medications Prior to Visit  Medication Sig Dispense Refill  . levothyroxine (SYNTHROID, LEVOTHROID) 112 MCG tablet Take 1 tablet (112 mcg total) by mouth daily  before breakfast. 90 tablet 3  . lovastatin (MEVACOR) 20 MG tablet Take 1 tablet (20 mg total) by mouth at bedtime. 90 tablet 3  . Omega-3 Fatty Acids (FISH OIL) 1000 MG CAPS Take 1 capsule (1,000 mg total) by mouth daily.  0  . lisinopril (ZESTRIL) 10 MG tablet Take 1 tablet (10 mg total) by mouth daily. 90 tablet 1  . metFORMIN (GLUCOPHAGE) 500 MG tablet Take 1 tablet (500 mg total) by mouth daily with breakfast. 90 tablet 1   No facility-administered medications prior to visit.     Per HPI unless specifically indicated in ROS section below Review of Systems Objective:    BP (!) 146/98 (BP Location: Right Arm, Patient Position: Sitting, Cuff Size: Large)   Pulse 76   Temp 97.7 F (36.5 C) (Temporal)   Ht 5' 7.75" (1.721 m)   Wt 190 lb 7 oz (86.4 kg)   SpO2 96%   BMI 29.17 kg/m   Wt Readings from Last 3 Encounters:  10/19/19 190 lb 7 oz (86.4 kg)  04/17/19 175 lb 4 oz (79.5 kg)  10/31/18 187 lb 8 oz (85 kg)    Physical Exam Vitals and nursing note reviewed.  Constitutional:      General: He is not in acute distress.    Appearance: Normal appearance. He is well-developed. He is not ill-appearing.  HENT:     Head: Normocephalic and atraumatic.     Right Ear: External ear normal.     Left Ear: External  ear normal.     Nose: Nose normal.     Mouth/Throat:     Pharynx: No oropharyngeal exudate.  Eyes:     General: No scleral icterus.    Conjunctiva/sclera: Conjunctivae normal.     Pupils: Pupils are equal, round, and reactive to light.  Cardiovascular:     Rate and Rhythm: Normal rate and regular rhythm.     Pulses: Normal pulses.     Heart sounds: Normal heart sounds. No murmur.  Pulmonary:     Effort: Pulmonary effort is normal. No respiratory distress.     Breath sounds: Normal breath sounds. No wheezing, rhonchi or rales.  Abdominal:     General: Abdomen is flat.     Palpations: Abdomen is soft.     Hernia: There is no hernia in the left inguinal area.      Comments: Slight tender bulge with cough at R inguinal region  Musculoskeletal:     Cervical back: Normal range of motion and neck supple.     Comments: See HPI for foot exam if done  Lymphadenopathy:     Cervical: No cervical adenopathy.  Skin:    General: Skin is warm and dry.     Findings: No rash.  Neurological:     Mental Status: He is alert.       Results for orders placed or performed in visit on 10/19/19  POCT glycosylated hemoglobin (Hb A1C)  Result Value Ref Range   Hemoglobin A1C 7.4 (A) 4.0 - 5.6 %   HbA1c POC (<> result, manual entry)     HbA1c, POC (prediabetic range)     HbA1c, POC (controlled diabetic range)     Assessment & Plan:  This visit occurred during the SARS-CoV-2 public health emergency.  Safety protocols were in place, including screening questions prior to the visit, additional usage of staff PPE, and extensive cleaning of exam room while observing appropriate contact time as indicated for disinfecting solutions.   Problem List Items Addressed This Visit    Right lower quadrant abdominal pain    Endorses worsening discomfort, more noticeable with straining or heavy lifting. 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       Relevant Orders   Ambulatory referral to General Surgery   Essential hypertension    Chronic. Deteriorated, anticipate due to weight gain noted. Will increase lisinopril to 20mg  daily.       Relevant Medications   lisinopril (ZESTRIL) 20 MG tablet   Controlled diabetes mellitus type 2 with complications ([place]) - Primary    Chronic, deterioration noted along with weight gain. Declines pneumovax and DSME. Continue metformin 500mg  daily. He is motivated to renew efforts at healthy diet/lifestyle      Relevant Medications   metFORMIN (GLUCOPHAGE) 500 MG tablet   lisinopril (ZESTRIL) 20 MG tablet   Other Relevant Orders   POCT glycosylated hemoglobin (Hb A1C) (Completed)       Meds ordered this encounter    Medications  . metFORMIN (GLUCOPHAGE) 500 MG tablet    Sig: Take 1 tablet (500 mg total) by mouth daily with breakfast.    Dispense:  90 tablet    Refill:  1  . lisinopril (ZESTRIL) 20 MG tablet    Sig: Take 1 tablet (20 mg total) by mouth daily.    Dispense:  90 tablet    Refill:  1   Orders Placed This Encounter  Procedures  . Ambulatory referral to General Surgery  Referral Priority:   Routine    Referral Type:   Surgical    Referral Reason:   Specialty Services Required    Requested Specialty:   General Surgery    Number of Visits Requested:   1  . POCT glycosylated hemoglobin (Hb A1C)    Patient Instructions  Check on insurance preferred brand for glucose meter.  Look into pneumovax-23 - helps prevent bacterial pneumonia caused by streptococcal pneumonia.  Continue metformin 500mg  daily. Return as needed or in 6 months for physical.  bp is staying elevated - increase lisinopril to 20mg  daily , may take 2 at a time until you run out. New dose will be at pharmacy.  We will refer you to gen surgery for further evaluation of possible right inguinal hernia.    Follow up plan: Return in about 6 months (around 04/17/2020) for annual exam, prior fasting for blood work.  Ria Bush, MD

## 2019-10-19 NOTE — Assessment & Plan Note (Addendum)
Chronic. Deteriorated, anticipate due to weight gain noted. Will increase lisinopril to 20mg  daily.

## 2019-10-19 NOTE — Assessment & Plan Note (Signed)
Endorses worsening discomfort, more noticeable with straining or heavy lifting. 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

## 2019-10-30 ENCOUNTER — Other Ambulatory Visit: Payer: Self-pay | Admitting: Family Medicine

## 2019-10-30 ENCOUNTER — Encounter: Payer: Self-pay | Admitting: Family Medicine

## 2019-10-30 DIAGNOSIS — E118 Type 2 diabetes mellitus with unspecified complications: Secondary | ICD-10-CM

## 2019-10-30 NOTE — Telephone Encounter (Signed)
Ok to do this. plz send in.  Check sugars once daily.  Diagnosis code: E11.8.

## 2019-10-30 NOTE — Telephone Encounter (Signed)
Pt's wife called stating pharmacy needs RX in order to get the glucometer. States it is for the [facility] and it needs to be sent to [place].

## 2019-10-31 ENCOUNTER — Other Ambulatory Visit: Payer: Self-pay | Admitting: Family Medicine

## 2019-10-31 MED ORDER — FREESTYLE LITE TEST VI STRP: ORAL_STRIP | 3 refills | Status: DC

## 2019-10-31 MED ORDER — FREESTYLE LITE DEVI: 0 refills | Status: DC

## 2019-10-31 NOTE — Addendum Note (Signed)
Addended by: Brenton Grills on: [id number] AM   Modules accepted: Orders

## 2019-10-31 NOTE — Telephone Encounter (Addendum)
Spoke with Pam at [facility] to see which Freestyle is covered.  States [facility] covers Freestyle Lite meter.   E-scribed Freestyle Lite meter and test strips.

## 2019-11-01 ENCOUNTER — Other Ambulatory Visit: Payer: Self-pay | Admitting: Family Medicine

## 2019-11-08 ENCOUNTER — Other Ambulatory Visit: Payer: Self-pay | Admitting: Family Medicine

## 2019-11-15 ENCOUNTER — Encounter: Payer: Self-pay | Admitting: Gastroenterology

## 2019-12-13 ENCOUNTER — Other Ambulatory Visit: Payer: Self-pay | Admitting: Family Medicine

## 2020-03-22 ENCOUNTER — Other Ambulatory Visit: Payer: Self-pay | Admitting: Family Medicine

## 2020-04-12 LAB — HM DIABETES EYE EXAM

## 2020-04-15 ENCOUNTER — Encounter: Payer: Self-pay | Admitting: Family Medicine

## 2020-11-08 ENCOUNTER — Telehealth: Payer: Self-pay | Admitting: Family Medicine

## 2020-11-08 ENCOUNTER — Other Ambulatory Visit: Payer: Self-pay | Admitting: Family Medicine

## 2020-11-08 DIAGNOSIS — E118 Type 2 diabetes mellitus with unspecified complications: Secondary | ICD-10-CM

## 2020-11-08 MED ORDER — FREESTYLE LITE DEVI: 0 refills | Status: DC

## 2020-11-08 NOTE — Telephone Encounter (Signed)
E-scribed new meter to [facility].

## 2020-11-08 NOTE — Telephone Encounter (Signed)
Pt wife called in wanted to know about getting a new meter.

## 2020-11-20 ENCOUNTER — Other Ambulatory Visit: Payer: Self-pay | Admitting: Family Medicine

## 2020-11-20 ENCOUNTER — Other Ambulatory Visit: Payer: Self-pay

## 2020-11-20 ENCOUNTER — Ambulatory Visit (INDEPENDENT_AMBULATORY_CARE_PROVIDER_SITE_OTHER): Payer: No Typology Code available for payment source | Admitting: Family Medicine

## 2020-11-20 ENCOUNTER — Encounter: Payer: Self-pay | Admitting: Family Medicine

## 2020-11-20 VITALS — BP 162/94 | HR 81 | Temp 97.8°F | Ht 67.75 in | Wt 190.0 lb

## 2020-11-20 DIAGNOSIS — E118 Type 2 diabetes mellitus with unspecified complications: Secondary | ICD-10-CM

## 2020-11-20 DIAGNOSIS — Z125 Encounter for screening for malignant neoplasm of prostate: Secondary | ICD-10-CM | POA: Diagnosis not present

## 2020-11-20 DIAGNOSIS — I10 Essential (primary) hypertension: Secondary | ICD-10-CM

## 2020-11-20 DIAGNOSIS — E1169 Type 2 diabetes mellitus with other specified complication: Secondary | ICD-10-CM

## 2020-11-20 DIAGNOSIS — E785 Hyperlipidemia, unspecified: Secondary | ICD-10-CM

## 2020-11-20 DIAGNOSIS — E039 Hypothyroidism, unspecified: Secondary | ICD-10-CM

## 2020-11-20 DIAGNOSIS — E1165 Type 2 diabetes mellitus with hyperglycemia: Secondary | ICD-10-CM

## 2020-11-20 DIAGNOSIS — IMO0002 Reserved for concepts with insufficient information to code with codable children: Secondary | ICD-10-CM

## 2020-11-20 LAB — LIPID PANEL
Cholesterol: 203 mg/dL — ABNORMAL HIGH (ref 0–200)
HDL: 32.2 mg/dL — ABNORMAL LOW (ref >39.00)
LDL Cholesterol: 148 mg/dL — ABNORMAL HIGH (ref 0–99)
NonHDL: 170.45
Total CHOL/HDL Ratio: 6
Triglycerides: 114 mg/dL (ref 0.0–149.0)
VLDL: 22.8 mg/dL (ref 0.0–40.0)

## 2020-11-20 LAB — COMPREHENSIVE METABOLIC PANEL
ALT: 22 U/L (ref 0–53)
AST: 18 U/L (ref 0–37)
Albumin: 4.4 g/dL (ref 3.5–5.2)
Alkaline Phosphatase: 48 U/L (ref 39–117)
BUN: 17 mg/dL (ref 6–23)
CO2: 28 mEq/L (ref 19–32)
Calcium: 9.5 mg/dL (ref 8.4–10.5)
Chloride: 103 mEq/L (ref 96–112)
Creatinine, Ser: 1 mg/dL (ref 0.40–1.50)
GFR: 84.74 mL/min (ref >60.00)
Glucose, Bld: 165 mg/dL — ABNORMAL HIGH (ref 70–99)
Potassium: 4.5 mEq/L (ref 3.5–5.1)
Sodium: 138 mEq/L (ref 135–145)
Total Bilirubin: 0.5 mg/dL (ref 0.2–1.2)
Total Protein: 7.4 g/dL (ref 6.0–8.3)

## 2020-11-20 LAB — POCT GLYCOSYLATED HEMOGLOBIN (HGB A1C): Hemoglobin A1C: 7.5 % — AB (ref 4.0–5.6)

## 2020-11-20 LAB — PSA: PSA: 1.6 ng/mL (ref 0.10–4.00)

## 2020-11-20 LAB — TSH: TSH: 9.48 u[IU]/mL — ABNORMAL HIGH (ref 0.35–4.50)

## 2020-11-20 MED ORDER — LEVOTHYROXINE SODIUM 112 MCG PO TABS: 112.0000 ug | ORAL_TABLET | Freq: Every day | ORAL | 3 refills | Status: DC

## 2020-11-20 MED ORDER — LISINOPRIL 20 MG PO TABS: 20.0000 mg | ORAL_TABLET | Freq: Every day | ORAL | 3 refills | Status: DC

## 2020-11-20 MED ORDER — OZEMPIC (0.25 OR 0.5 MG/DOSE) 2 MG/1.5ML ~~LOC~~ SOPN: PEN_INJECTOR | SUBCUTANEOUS | 6 refills | Status: DC

## 2020-11-20 MED ORDER — LOVASTATIN 20 MG PO TABS: 20.0000 mg | ORAL_TABLET | Freq: Every day | ORAL | 3 refills | Status: DC

## 2020-11-20 MED ORDER — FREESTYLE LITE TEST VI STRP: ORAL_STRIP | 3 refills | Status: DC

## 2020-11-20 NOTE — Assessment & Plan Note (Addendum)
Chronic, uncontrolled. Has been off metformin - not tolerating well. Will stop. Reviewed other options including SGLT2, GLP1 RA, actos and sulfonylureas. Price out GLP1RA, if unaffordable consider actos (in h/o fatty liver). No fmhx thyroid cancer or personal h/o pancreatitis. Strong fmhx thyroid disease. Discussed possible association of actos with bladder cancer.  Refer to diabetes classes.

## 2020-11-20 NOTE — Patient Instructions (Addendum)
We will refer you to diabetes classes.  Stop metformin.  Price out weekly ozempic sent to pharmacy in place of metformin. Start at 0.25mg  weekly for 2 weeks then increase to 0.5mg  weekly. Watch for GI upset, nausea.  Continue other medicines Labs today  Return in 6 months for physical.   Semaglutide injection solution What is this medicine? SEMAGLUTIDE (Sem a GLOO tide) is used to improve blood sugar control in adults with type 2 diabetes. This medicine may be used with other diabetes medicines. This drug may also reduce the risk of heart attack or stroke if you have type 2 diabetes and risk factors for heart disease. This medicine may be used for other purposes; ask your health care provider or pharmacist if you have questions. COMMON BRAND NAME(S): OZEMPIC What should I tell my health care provider before I take this medicine? They need to know if you have any of these conditions:  endocrine tumors (MEN 2) or if someone in your family had these tumors  eye disease, vision problems  history of pancreatitis  kidney disease  stomach problems  thyroid cancer or if someone in your family had thyroid cancer  an unusual or allergic reaction to semaglutide, other medicines, foods, dyes, or preservatives  pregnant or trying to get pregnant  breast-feeding How should I use this medicine? This medicine is for injection under the skin of your upper leg (thigh), stomach area, or upper arm. It is given once every week (every 7 days). You will be taught how to prepare and give this medicine. Use exactly as directed. Take your medicine at regular intervals. Do not take it more often than directed. If you use this medicine with insulin, you should inject this medicine and the insulin separately. Do not mix them together. Do not give the injections right next to each other. Change (rotate) injection sites with each injection. It is important that you put your used needles and syringes in a  special sharps container. Do not put them in a trash can. If you do not have a sharps container, call your pharmacist or healthcare provider to get one. A special MedGuide will be given to you by the pharmacist with each prescription and refill. Be sure to read this information carefully each time. This drug comes with INSTRUCTIONS FOR USE. Ask your pharmacist for directions on how to use this drug. Read the information carefully. Talk to your pharmacist or health care provider if you have questions. Talk to your pediatrician regarding the use of this medicine in children. Special care may be needed. Overdosage: If you think you have taken too much of this medicine contact a poison control center or emergency room at once. NOTE: This medicine is only for you. Do not share this medicine with others. What if I miss a dose? If you miss a dose, take it as soon as you can within 5 days after the missed dose. Then take your next dose at your regular weekly time. If it has been longer than 5 days after the missed dose, do not take the missed dose. Take the next dose at your regular time. Do not take double or extra doses. If you have questions about a missed dose, contact your health care provider for advice. What may interact with this medicine?  other medicines for diabetes Many medications may cause changes in blood sugar, these include:  alcohol containing beverages  antiviral medicines for HIV or AIDS  aspirin and aspirin-like drugs  certain medicines  for blood pressure, heart disease, irregular heart beat  chromium  diuretics  male hormones, such as estrogens or progestins, birth control pills  fenofibrate  gemfibrozil  isoniazid  lanreotide  male hormones or anabolic steroids  MAOIs like Carbex, Eldepryl, Marplan, Nardil, and Parnate  medicines for weight loss  medicines for allergies, asthma, cold, or cough  medicines for depression, anxiety, or psychotic  disturbances  niacin  nicotine  NSAIDs, medicines for pain and inflammation, like ibuprofen or naproxen  octreotide  pasireotide  pentamidine  phenytoin  probenecid  quinolone antibiotics such as ciprofloxacin, levofloxacin, ofloxacin  some herbal dietary supplements  steroid medicines such as prednisone or cortisone  sulfamethoxazole; trimethoprim  thyroid hormones Some medications can hide the warning symptoms of low blood sugar (hypoglycemia). You may need to monitor your blood sugar more closely if you are taking one of these medications. These include:  beta-blockers, often used for high blood pressure or heart problems (examples include atenolol, metoprolol, propranolol)  clonidine  guanethidine  reserpine This list may not describe all possible interactions. Give your health care provider a list of all the medicines, herbs, non-prescription drugs, or dietary supplements you use. Also tell them if you smoke, drink alcohol, or use illegal drugs. Some items may interact with your medicine. What should I watch for while using this medicine? Visit your doctor or health care professional for regular checks on your progress. Drink plenty of fluids while taking this medicine. Check with your doctor or health care professional if you get an attack of severe diarrhea, nausea, and vomiting. The loss of too much body fluid can make it dangerous for you to take this medicine. A test called the HbA1C (A1C) will be monitored. This is a simple blood test. It measures your blood sugar control over the last 2 to 3 months. You will receive this test every 3 to 6 months. Learn how to check your blood sugar. Learn the symptoms of low and high blood sugar and how to manage them. Always carry a quick-source of sugar with you in case you have symptoms of low blood sugar. Examples include hard sugar candy or glucose tablets. Make sure others know that you can choke if you eat or drink when  you develop serious symptoms of low blood sugar, such as seizures or unconsciousness. They must get medical help at once. Tell your doctor or health care professional if you have high blood sugar. You might need to change the dose of your medicine. If you are sick or exercising more than usual, you might need to change the dose of your medicine. Do not skip meals. Ask your doctor or health care professional if you should avoid alcohol. Many nonprescription cough and cold products contain sugar or alcohol. These can affect blood sugar. Pens should never be shared. Even if the needle is changed, sharing may result in passing of viruses like hepatitis or HIV. Wear a medical ID bracelet or chain, and carry a card that describes your disease and details of your medicine and dosage times. Do not become pregnant while taking this medicine. Women should inform their doctor if they wish to become pregnant or think they might be pregnant. There is a potential for serious side effects to an unborn child. Talk to your health care professional or pharmacist for more information. What side effects may I notice from receiving this medicine? Side effects that you should report to your doctor or health care professional as soon as possible:  allergic  reactions like skin rash, itching or hives, swelling of the face, lips, or tongue  breathing problems  changes in vision  diarrhea that continues or is severe  lump or swelling on the neck  severe nausea  signs and symptoms of infection like fever or chills; cough; sore throat; pain or trouble passing urine  signs and symptoms of low blood sugar such as feeling anxious, confusion, dizziness, increased hunger, unusually weak or tired, sweating, shakiness, cold, irritable, headache, blurred vision, fast heartbeat, loss of consciousness  signs and symptoms of kidney injury like trouble passing urine or change in the amount of urine  trouble swallowing  unusual  stomach upset or pain  vomiting Side effects that usually do not require medical attention (report to your doctor or health care professional if they continue or are bothersome):  constipation  diarrhea  nausea  pain, redness, or irritation at site where injected  stomach upset This list may not describe all possible side effects. Call your doctor for medical advice about side effects. You may report side effects to FDA at 1-800-FDA-1088. Where should I keep my medicine? Keep out of the reach of children. Store unopened pens in a refrigerator between 2 and 8 degrees C (36 and 46 degrees F). Do not freeze. Protect from light and heat. After you first use the pen, it can be stored for 56 days at room temperature between 15 and 30 degrees C (59 and 86 degrees F) or in a refrigerator. Throw away your used pen after 56 days or after the expiration date, whichever comes first. Do not store your pen with the needle attached. If the needle is left on, medicine may leak from the pen. NOTE: This sheet is a summary. It may not cover all possible information. If you have questions about this medicine, talk to your doctor, pharmacist, or health care provider.  2021 Elsevier/Gold Standard (2019-06-06 09:41:51)

## 2020-11-20 NOTE — Progress Notes (Signed)
Patient ID: Jonathan Thomas, male    DOB: Jul 24, 1966, 55 y.o.   MRN: [id number]  This visit was conducted in person.  BP (!) 162/94   Pulse 81   Temp 97.8 F (36.6 C) (Temporal)   Ht 5' 7.75" (1.721 m)   Wt 190 lb (86.2 kg)   SpO2 97%   BMI 29.10 kg/m    CC: discuss meds  Subjective:   HPI: Jonathan Thomas is a 55 y.o. male presenting on 11/20/2020 for Discuss Medication (Wants to discuss changing metformin. )   [place] 04/2020, 05/2020.   Hypothyroid - compliant with levothyroxine 142mcg daily.   HTN - Compliant with current antihypertensive regimen of lisinopril 20mg  daily - ran out a few days ago. Anticipate may have run out prior. Does not check blood pressures at home: no cuff. No low blood pressure readings or symptoms of dizziness/syncope. Denies vision changes, CP/tightness, SOB, leg swelling. Occasional headache.   DM - does regularly check sugars but needs test strips. Was on metformin 500mg  daily - but he's not tolerating well due to GI disturbance, diarrhea, etc. Denies low sugars or hypoglycemic symptoms. Denies paresthesias. Last diabetic eye exam 04/2020. Pneumovax: declines. Prevnar: not due. Glucometer brand: freestyle. DSME: may be interested in virtual classes.  Lab Results  Component Value Date   HGBA1C 7.5 (A) 11/20/2020   Diabetic Foot Exam - Simple   Simple Foot Form Diabetic Foot exam was performed with the following findings: Yes 11/20/2020  9:22 AM  Visual Inspection No deformities, no ulcerations, no other skin breakdown bilaterally: Yes Sensation Testing Intact to touch and monofilament testing bilaterally: Yes Pulse Check Posterior Tibialis and Dorsalis pulse intact bilaterally: Yes Comments    No results found for: Derl Barrow      Relevant past medical, surgical, family and social history reviewed and updated as indicated. Interim medical history since our last visit reviewed. Allergies and medications reviewed and  updated. Outpatient Medications Prior to Visit  Medication Sig Dispense Refill  . Blood Glucose Monitoring Suppl (FREESTYLE LITE) DEVI Use as instructed to check blood sugar once daily. 1 each 0  . Omega-3 Fatty Acids (FISH OIL) 1000 MG CAPS Take 1 capsule (1,000 mg total) by mouth daily.  0  . glucose blood (FREESTYLE LITE) test strip Use as instructed to check blood sugar once daily 100 each 3  . levothyroxine (SYNTHROID) 112 MCG tablet TAKE 1 TABLET (112 MCG TOTAL) BY MOUTH DAILY BEFORE BREAKFAST. 90 tablet 3  . lisinopril (ZESTRIL) 20 MG tablet Take 1 tablet (20 mg total) by mouth daily. 90 tablet 1  . lovastatin (MEVACOR) 20 MG tablet TAKE 1 TABLET (20 MG TOTAL) BY MOUTH AT BEDTIME. 90 tablet 1  . metFORMIN (GLUCOPHAGE) 500 MG tablet Take 1 tablet (500 mg total) by mouth daily with breakfast. 90 tablet 1  . lisinopril (ZESTRIL) 10 MG tablet TAKE 1 TABLET BY MOUTH DAILY. 90 tablet 1   No facility-administered medications prior to visit.     Per HPI unless specifically indicated in ROS section below Review of Systems Objective:  BP (!) 162/94   Pulse 81   Temp 97.8 F (36.6 C) (Temporal)   Ht 5' 7.75" (1.721 m)   Wt 190 lb (86.2 kg)   SpO2 97%   BMI 29.10 kg/m   Wt Readings from Last 3 Encounters:  11/20/20 190 lb (86.2 kg)  10/19/19 190 lb 7 oz (86.4 kg)  04/17/19 175 lb 4 oz (79.5 kg)  Physical Exam Vitals and nursing note reviewed.  Constitutional:      General: He is not in acute distress.    Appearance: Normal appearance. He is well-developed and well-nourished. He is not ill-appearing.  HENT:     Mouth/Throat:     Mouth: Oropharynx is clear and moist.  Eyes:     Extraocular Movements: Extraocular movements intact and EOM normal.     Conjunctiva/sclera: Conjunctivae normal.     Pupils: Pupils are equal, round, and reactive to light.  Cardiovascular:     Rate and Rhythm: Normal rate and regular rhythm.     Pulses: Normal pulses and intact distal pulses.      Heart sounds: Normal heart sounds. No murmur heard.   Pulmonary:     Effort: Pulmonary effort is normal. No respiratory distress.     Breath sounds: Normal breath sounds. No wheezing, rhonchi or rales.  Musculoskeletal:        General: No edema. Normal range of motion.     Cervical back: Normal range of motion and neck supple.     Right lower leg: No edema.     Left lower leg: No edema.     Comments: See HPI for foot exam if done  Lymphadenopathy:     Cervical: No cervical adenopathy.  Skin:    General: Skin is warm and dry.     Findings: No rash.  Neurological:     Mental Status: He is alert.  Psychiatric:        Mood and Affect: Mood and affect normal.       Results for orders placed or performed in visit on 11/20/20  POCT glycosylated hemoglobin (Hb A1C)  Result Value Ref Range   Hemoglobin A1C 7.5 (A) 4.0 - 5.6 %   HbA1c POC (<> result, manual entry)     HbA1c, POC (prediabetic range)     HbA1c, POC (controlled diabetic range)     Assessment & Plan:  This visit occurred during the SARS-CoV-2 public health emergency.  Safety protocols were in place, including screening questions prior to the visit, additional usage of staff PPE, and extensive cleaning of exam room while observing appropriate contact time as indicated for disinfecting solutions.   Problem List Items Addressed This Visit    Hypothyroidism    Chronic, stable. Continue thyroid replacement, update levels.       Relevant Medications   levothyroxine (SYNTHROID) 112 MCG tablet   Other Relevant Orders   TSH   Hyperlipidemia associated with type 2 diabetes mellitus ([place])    Update FLP - continue atorvastatin. The 10-year ASCVD risk score Mikey Bussing DC Brooke Bonito., et al., 2013) is: 32.4%   Values used to calculate the score:     Age: 32 years     Sex: Male     Is Non-Hispanic African American: Yes     Diabetic: Yes     Tobacco smoker: No     Systolic Blood Pressure: 315 mmHg     Is BP treated: Yes     HDL  Cholesterol: 29.1 mg/dL     Total Cholesterol: 171 mg/dL       Relevant Medications   lisinopril (ZESTRIL) 20 MG tablet   lovastatin (MEVACOR) 20 MG tablet   Semaglutide,0.25 or 0.5MG /DOS, (OZEMPIC, 0.25 OR 0.5 MG/DOSE,) 2 MG/1.5ML SOPN   Other Relevant Orders   Lipid panel   Comprehensive metabolic panel   Essential hypertension    Chronic, uncontrolled - has not been taking lisinopril. Restart.  Relevant Medications   lisinopril (ZESTRIL) 20 MG tablet   lovastatin (MEVACOR) 20 MG tablet   Diabetes mellitus type 2, uncontrolled, with complications ([facility]) - Primary    Chronic, uncontrolled. Has been off metformin - not tolerating well. Will stop. Reviewed other options including SGLT2, GLP1 RA, actos and sulfonylureas. Price out GLP1RA, if unaffordable consider actos (in h/o fatty liver). No fmhx thyroid cancer or personal h/o pancreatitis. Strong fmhx thyroid disease. Discussed possible association of actos with bladder cancer.  Refer to diabetes classes.       Relevant Medications   glucose blood (FREESTYLE LITE) test strip   lisinopril (ZESTRIL) 20 MG tablet   lovastatin (MEVACOR) 20 MG tablet   Semaglutide,0.25 or 0.5MG /DOS, (OZEMPIC, 0.25 OR 0.5 MG/DOSE,) 2 MG/1.5ML SOPN   Other Relevant Orders   POCT glycosylated hemoglobin (Hb A1C) (Completed)   Ambulatory referral to diabetic education    Other Visit Diagnoses    Special screening for malignant neoplasm of prostate       Relevant Orders   PSA       Meds ordered this encounter  Medications  . glucose blood (FREESTYLE LITE) test strip    Sig: Use as instructed to check blood sugar once daily    Dispense:  100 each    Refill:  3  . levothyroxine (SYNTHROID) 112 MCG tablet    Sig: Take 1 tablet (112 mcg total) by mouth daily before breakfast.    Dispense:  90 tablet    Refill:  3  . lisinopril (ZESTRIL) 20 MG tablet    Sig: Take 1 tablet (20 mg total) by mouth daily.    Dispense:  90 tablet    Refill:  3   . lovastatin (MEVACOR) 20 MG tablet    Sig: Take 1 tablet (20 mg total) by mouth at bedtime.    Dispense:  90 tablet    Refill:  3  . Semaglutide,0.25 or 0.5MG /DOS, (OZEMPIC, 0.25 OR 0.5 MG/DOSE,) 2 MG/1.5ML SOPN    Sig: Inject 0.25 mg into the skin once a week for 14 days, THEN 0.5 mg once a week.    Dispense:  1.5 mL    Refill:  6   Orders Placed This Encounter  Procedures  . TSH  . Lipid panel  . Comprehensive metabolic panel  . PSA  . Ambulatory referral to diabetic education    Referral Priority:   Routine    Referral Type:   Consultation    Referral Reason:   Specialty Services Required    Number of Visits Requested:   1  . POCT glycosylated hemoglobin (Hb A1C)    Patient instructions: We will refer you to diabetes classes.  Stop metformin.  Price out weekly ozempic sent to pharmacy in place of metformin. Start at 0.25mg  weekly for 2 weeks then increase to 0.5mg  weekly. Watch for GI upset, nausea.  Continue other medicines Labs today  Return in 6 months for physical.   Follow up plan: Return in about 6 months (around 05/20/2021) for annual exam, prior fasting for blood work.  Ria Bush, MD

## 2020-11-20 NOTE — Assessment & Plan Note (Signed)
Update FLP - continue atorvastatin. The 10-year ASCVD risk score Mikey Bussing DC Brooke Bonito., et al., 2013) is: 32.4%   Values used to calculate the score:     Age: 55 years     Sex: Male     Is Non-Hispanic African American: Yes     Diabetic: Yes     Tobacco smoker: No     Systolic Blood Pressure: 290 mmHg     Is BP treated: Yes     HDL Cholesterol: 29.1 mg/dL     Total Cholesterol: 171 mg/dL

## 2020-11-20 NOTE — Assessment & Plan Note (Signed)
Chronic, uncontrolled - has not been taking lisinopril. Restart.

## 2020-11-20 NOTE — Assessment & Plan Note (Addendum)
Chronic, stable. Continue thyroid replacement, update levels.

## 2020-11-23 ENCOUNTER — Other Ambulatory Visit: Payer: Self-pay | Admitting: Family Medicine

## 2020-11-23 MED ORDER — LEVOTHYROXINE SODIUM 125 MCG PO TABS
125.0000 ug | ORAL_TABLET | Freq: Every day | ORAL | 3 refills | Status: DC
Start: 2020-11-23 — End: 2020-11-23

## 2020-12-11 ENCOUNTER — Telehealth: Payer: Self-pay | Admitting: Family Medicine

## 2020-12-11 NOTE — Telephone Encounter (Signed)
Pt wife Shreffler called in wanted to know if Dr. Darnell Level can get another blood sugar monitor he has the freestyle and wanted to know if can get the OneTouch

## 2020-12-12 ENCOUNTER — Other Ambulatory Visit: Payer: Self-pay | Admitting: Family Medicine

## 2020-12-12 MED ORDER — ONETOUCH DELICA LANCETS 33G MISC: 1 refills | Status: DC

## 2020-12-12 MED ORDER — ONETOUCH ULTRA 2 W/DEVICE KIT: PACK | 0 refills | Status: DC

## 2020-12-12 MED ORDER — ONETOUCH ULTRA VI STRP: ORAL_STRIP | 1 refills | Status: DC

## 2020-12-12 NOTE — Telephone Encounter (Signed)
Noted  

## 2020-12-12 NOTE — Telephone Encounter (Signed)
E-scribed OneTouch meter, test strips and lancets.    Plz schedule lab and cpe visit (around 05/20/2021), per Dr. Darnell Level.

## 2020-12-12 NOTE — Telephone Encounter (Signed)
Called and left vm for the patient to call and schedule cpe and labs. Left Detailed message per ok DPR. EM

## 2021-02-17 MED FILL — Lisinopril Tab 20 MG: ORAL | 90 days supply | Qty: 90 | Fill #0 | Status: AC

## 2021-02-18 ENCOUNTER — Other Ambulatory Visit: Payer: Self-pay

## 2021-02-28 ENCOUNTER — Telehealth: Payer: Self-pay

## 2021-02-28 ENCOUNTER — Ambulatory Visit: Payer: No Typology Code available for payment source | Admitting: Primary Care

## 2021-02-28 ENCOUNTER — Ambulatory Visit (INDEPENDENT_AMBULATORY_CARE_PROVIDER_SITE_OTHER): Payer: No Typology Code available for payment source | Admitting: Family Medicine

## 2021-02-28 ENCOUNTER — Other Ambulatory Visit: Payer: Self-pay

## 2021-02-28 ENCOUNTER — Encounter: Payer: Self-pay | Admitting: Family Medicine

## 2021-02-28 VITALS — BP 156/94 | HR 64 | Temp 97.9°F | Ht 67.75 in | Wt 181.1 lb

## 2021-02-28 DIAGNOSIS — E039 Hypothyroidism, unspecified: Secondary | ICD-10-CM | POA: Diagnosis not present

## 2021-02-28 DIAGNOSIS — I10 Essential (primary) hypertension: Secondary | ICD-10-CM | POA: Diagnosis not present

## 2021-02-28 DIAGNOSIS — M25512 Pain in left shoulder: Secondary | ICD-10-CM | POA: Diagnosis not present

## 2021-02-28 DIAGNOSIS — IMO0002 Reserved for concepts with insufficient information to code with codable children: Secondary | ICD-10-CM

## 2021-02-28 DIAGNOSIS — E1165 Type 2 diabetes mellitus with hyperglycemia: Secondary | ICD-10-CM

## 2021-02-28 DIAGNOSIS — E118 Type 2 diabetes mellitus with unspecified complications: Secondary | ICD-10-CM | POA: Diagnosis not present

## 2021-02-28 LAB — TSH: TSH: 3.07 u[IU]/mL (ref 0.35–4.50)

## 2021-02-28 LAB — HEMOGLOBIN A1C: Hgb A1c MFr Bld: 6.4 % (ref 4.6–6.5)

## 2021-02-28 MED ORDER — NAPROXEN 500 MG PO TABS
ORAL_TABLET | ORAL | 0 refills | Status: DC
  Filled 2021-02-28: qty 40, 20d supply, fill #0

## 2021-02-28 NOTE — Assessment & Plan Note (Signed)
Update TSH on higher levothyroxine dose

## 2021-02-28 NOTE — Assessment & Plan Note (Signed)
Update A1c. Now only on ozempic 0.5mg  weekly. Metformin intolerance.

## 2021-02-28 NOTE — Progress Notes (Signed)
Patient ID: Jonathan Thomas, male    DOB: 25-Jul-1966, 55 y.o.   MRN: [id number]  This visit was conducted in person.  BP (!) 156/94   Pulse 64   Temp 97.9 F (36.6 C) (Temporal)   Ht 5' 7.75" (1.721 m)   Wt 181 lb 1 oz (82.1 kg)   SpO2 97%   BMI 27.73 kg/m    CC: L shoulder pain  Subjective:   HPI: Jonathan Thomas is a 55 y.o. male presenting on 02/28/2021 for Shoulder Pain (C/o left shoulder pain radiating into left arm.  Also, c/o tingling in fingers.  Started about 1 wk ago.  Reports feeling same pain while shooting pool, about 1 mo ago.  Pain worse at night, disturbing sleep. )   1 mo h/o L anterior shoulder achy pain with radiation down arm and even all 5 fingertips feel numb. Acutely worse this past week. Denies weakness of arm. Occasional left sided neck pain.  Treating with aleve and tylenol without benefit. Heating pad didn't really help.  Positional pain, worse at night when laying on left side.  He does play pool regularly. Did improve when he took a break from pool, may have started after restarting playing pool.  Denies inciting trauma/injury or falls.  Mows lawns for work.   New start ozempic 11/2020 - now on 0.47m weekly injection.      Relevant past medical, surgical, family and social history reviewed and updated as indicated. Interim medical history since our last visit reviewed. Allergies and medications reviewed and updated. Outpatient Medications Prior to Visit  Medication Sig Dispense Refill  . Blood Glucose Monitoring Suppl (FREESTYLE FREEDOM LITE) w/Device KIT USE AS INSTRUCTED TO CHECK BLOOD SUGAR ONCE DAILY 1 kit 0  . Blood Glucose Monitoring Suppl (FREESTYLE FREEDOM LITE) w/Device KIT USE AS DIRECTED TO CHECK BLOOD SUGAR ONCE DAILY 1 kit 0  . Blood Glucose Monitoring Suppl (ONE TOUCH ULTRA 2) w/Device KIT Use as instructed to check blood sugar once daily 1 kit 0  . glucose blood test strip USE AS INSTRUCTED TO CHECK BLOOD SUGAR ONCE DAILY (Patient  taking differently: USE AS INSTRUCTED TO CHECK BLOOD SUGAR ONCE DAILY) 100 strip 1  . Lancets (FREESTYLE) lancets USE AS INSTRUCTED TO CHECK BLOOD SUGAR ONCE DAILY (Patient taking differently: USE AS INSTRUCTED TO CHECK BLOOD SUGAR ONCE DAILY) 100 each 1  . levothyroxine (SYNTHROID) 125 MCG tablet TAKE 1 TABLET BY MOUTH DAILY BEFORE BREAKFAST 90 tablet 3  . lisinopril (ZESTRIL) 20 MG tablet TAKE 1 TABLET BY MOUTH DAILY. 90 tablet 3  . lovastatin (MEVACOR) 20 MG tablet TAKE 1 TABLET BY MOUTH AT BEDTIME. 90 tablet 3  . Omega-3 Fatty Acids (FISH OIL) 1000 MG CAPS Take 1 capsule (1,000 mg total) by mouth daily.  0  . OneTouch Delica Lancets 359DMISC Use as instructed to check blood sugar once daily 100 each 1  . Semaglutide,0.25 or 0.5MG/DOS, 2 MG/1.5ML SOPN INJECT 0.25 MG INTO THE SKIN ONCE A WEEK FOR 14 DAYS, THEN 0.5 MG ONCE A WEEK. 1.5 mL 6   No facility-administered medications prior to visit.     Per HPI unless specifically indicated in ROS section below Review of Systems Objective:  BP (!) 156/94   Pulse 64   Temp 97.9 F (36.6 C) (Temporal)   Ht 5' 7.75" (1.721 m)   Wt 181 lb 1 oz (82.1 kg)   SpO2 97%   BMI 27.73 kg/m   Wt Readings from  Last 3 Encounters:  02/28/21 181 lb 1 oz (82.1 kg)  11/20/20 190 lb (86.2 kg)  10/19/19 190 lb 7 oz (86.4 kg)      Physical Exam Vitals and nursing note reviewed.  Constitutional:      Appearance: Normal appearance. He is not ill-appearing.  Musculoskeletal:        General: Tenderness present.     Comments:  FROM at cervical neck without pain to palpation midline cervical spine or paracervical mm tenderness  R shoulder WNL L shoulder exam: No deformity of shoulders on inspection. Discomfort to palpation anterior left shoulder  FROM in abduction and forward flexion, mild limitations at end abduction. Mild discomfort with testing SITS in int rotation. Mild discomfort with empty can sign. Neg Speed test. Painful with impingement  testing. No pain with crossover test. No pain with rotation of humeral head in [facility] joint but crepitus noted.   Skin:    General: Skin is warm and dry.     Findings: No rash.  Neurological:     Mental Status: He is alert.     Sensory: Sensation is intact.     Motor: Motor function is intact.     Comments:  5/5 strength BUE Grip strength intact  Psychiatric:        Mood and Affect: Mood normal.        Behavior: Behavior normal.       Results for orders placed or performed in visit on 11/20/20  TSH  Result Value Ref Range   TSH 9.48 (H) 0.35 - 4.50 uIU/mL  Lipid panel  Result Value Ref Range   Cholesterol 203 (H) 0 - 200 mg/dL   Triglycerides 114.0 0.0 - 149.0 mg/dL   HDL 32.20 (L) >39.00 mg/dL   VLDL 22.8 0.0 - 40.0 mg/dL   LDL Cholesterol 148 (H) 0 - 99 mg/dL   Total CHOL/HDL Ratio 6    NonHDL 170.45   Comprehensive metabolic panel  Result Value Ref Range   Sodium 138 135 - 145 mEq/L   Potassium 4.5 3.5 - 5.1 mEq/L   Chloride 103 96 - 112 mEq/L   CO2 28 19 - 32 mEq/L   Glucose, Bld 165 (H) 70 - 99 mg/dL   BUN 17 6 - 23 mg/dL   Creatinine, Ser 1.00 0.40 - 1.50 mg/dL   Total Bilirubin 0.5 0.2 - 1.2 mg/dL   Alkaline Phosphatase 48 39 - 117 U/L   AST 18 0 - 37 U/L   ALT 22 0 - 53 U/L   Total Protein 7.4 6.0 - 8.3 g/dL   Albumin 4.4 3.5 - 5.2 g/dL   GFR 84.74 >60.00 mL/min   Calcium 9.5 8.4 - 10.5 mg/dL  PSA  Result Value Ref Range   PSA 1.60 0.10 - 4.00 ng/mL  POCT glycosylated hemoglobin (Hb A1C)  Result Value Ref Range   Hemoglobin A1C 7.5 (A) 4.0 - 5.6 %   HbA1c POC (<> result, manual entry)     HbA1c, POC (prediabetic range)     HbA1c, POC (controlled diabetic range)     Assessment & Plan:  This visit occurred during the SARS-CoV-2 public health emergency.  Safety protocols were in place, including screening questions prior to the visit, additional usage of staff PPE, and extensive cleaning of exam room while observing appropriate contact time as indicated  for disinfecting solutions.   Problem List Items Addressed This Visit    Diabetes mellitus type 2, uncontrolled, with complications ([place])  Update A1c. Now only on ozempic 0.46m weekly. Metformin intolerance.       Relevant Orders   Hemoglobin A1c   Essential hypertension    BP elevation noted - attributed to current pain.       Hypothyroidism    Update TSH on higher levothyroxine dose       Relevant Orders   TSH   Left shoulder pain - Primary    Left anterior shoulder pain worse when he plays billiards.  Exam suspicious for frozen shoulder vs RTC injury. Diabetes increases risk of frozen shoulder.  [facility]ul[facility]. Rx naproxyn 5022mbid x 1 wk then PRN. Avoid steroid in h/o uncontrolled diabetes.  Discussed if not improving with above, recommend return to see sports medicine Dr CoLorelei Pontor further evaluation. He agrees with plan.           Meds ordered this encounter  Medications  . naproxen (NAPROSYN) 500 MG tablet    Sig: Take one po bid x 1 week then prn pain, take with food    Dispense:  40 tablet    Refill:  0   Orders Placed This Encounter  Procedures  . TSH  . Hemoglobin A1c    Patient Instructions  Labs today  Possible frozen shoulder - do exercises provided today  Treat with prescription strength anti inflammatory.  If not improving with above, return to see our sports medicine doctor Dr CoLorelei Pont   Follow up plan: Return if symptoms worsen or fail to improve.  JaRia BushMD

## 2021-02-28 NOTE — Patient Instructions (Addendum)
Labs today  Possible frozen shoulder - do exercises provided today  Treat with prescription strength anti inflammatory.  If not improving with above, return to see our sports medicine doctor Dr Lorelei Pont.

## 2021-02-28 NOTE — Assessment & Plan Note (Signed)
Left anterior shoulder pain worse when he plays billiards.  Exam suspicious for frozen shoulder vs RTC injury. Diabetes increases risk of frozen shoulder.  Provided with exercises from [facility] pt advisor on adhesive capsulitis. Rx naproxyn 500mg  bid x 1 wk then PRN. Avoid steroid in h/o uncontrolled diabetes.  Discussed if not improving with above, recommend return to see sports medicine Dr Lorelei Pont for further evaluation. He agrees with plan.

## 2021-02-28 NOTE — Telephone Encounter (Signed)
Starting first of this week with sharp lt shoulder and arm pain and tingling of lt fingers on and off. Tingling does go up arm. No tingling now.  No known injury and only different thing done recently is played several games of pool. No CP, SOB, Dizziness or H/A. Pt can use lt arm and grasp cup without problem. No difficulty in walking. Pt wants only to see Dr Darnell Level. Pt does not want to go to UC. Dr Darnell Level will see pt today at 1 pm unless cancellation and Lattie Haw CMA will call pt at [phone] if need to change appt time; pts wife voiced understanding. UC & ED precautions given and pts wife voiced understanding. No covid symptoms or known exposure per pts wife.

## 2021-02-28 NOTE — Assessment & Plan Note (Signed)
BP elevation noted - attributed to current pain.

## 2021-03-15 MED FILL — Semaglutide Soln Pen-inj 0.25 or 0.5 MG/DOSE (2 MG/1.5ML): SUBCUTANEOUS | 30 days supply | Qty: 1.5 | Fill #0 | Status: AC

## 2021-03-17 ENCOUNTER — Other Ambulatory Visit: Payer: Self-pay

## 2021-03-18 NOTE — Progress Notes (Signed)
Nuala Chiles T. Mya Suell, MD, [place] at [facility] [place], [address]  Phone: [phone]  FAX: [phone]  Jonathan Thomas - 55 y.o. male  MRN [id number]  Date of Birth: 09/20/1966  Date: 03/19/2021  PCP: Ria Bush, MD  Referral: Ria Bush, MD  Chief Complaint  Patient presents with   Shoulder Pain    Left    This visit occurred during the SARS-CoV-2 public health emergency.  Safety protocols were in place, including screening questions prior to the visit, additional usage of staff PPE, and extensive cleaning of exam room while observing appropriate contact time as indicated for disinfecting solutions.   Subjective:   This 55 y.o. male patient noted above presents with shoulder pain that has been ongoing for 3 weeks. there is no history of trauma or accident recently The patient denies neck pain or radicular symptoms. Denies dislocation, subluxation, separation of the shoulder. The patient does complain of mild pain in the overhead plane with mild painful arc of motion.  He has taken some Naprosyn. His symptoms all started when he had been playing pool, which she has not done recently. Does a lot of yard work with work.  He does this for living.  Medications Tried: Tylenol, NSAIDS Tried PT: No  Prior shoulder Injury: No Prior surgery: No Prior fracture: No   Review of Systems is noted in the HPI, as appropriate  Objective:   Blood pressure 130/84, pulse 79, temperature 98 F (36.7 C), temperature source Temporal, height 5' 7.75" (1.721 m), weight 175 lb 12 oz (79.7 kg), SpO2 97 %.  GEN: No acute distress; alert,appropriate. PULM: Breathing comfortably in no respiratory distress PSYCH: Normally interactive.    Shoulder: L Inspection: No muscle wasting or winging Ecchymosis/edema: neg  AC joint, scapula, clavicle: NT Cervical spine: NT, full ROM Spurling's: neg Abduction: full,  5/5 Flexion: full, 5/5 IR, full, lift-off: 5/5 ER at neutral: full, 5/5 Range of motion equal to contralateral side AC crossover: neg Neer: pos Hawkins: pos Drop Test: neg Empty Can: pos Supraspinatus insertion: mild-mod T Bicipital groove: NT Speed's: neg Yergason's: neg Sulcus sign: neg Scapular dyskinesis: none C5-T1 intact  Neuro: Sensation intact Grip 5/5   Radiology: No results found.  Assessment and Plan:      ICD-10-CM   1. Rotator cuff tendinitis, left  M75.82     2. Impingement syndrome of left shoulder  M75.42      Acute rotator cuff tendinitis that is resolving with activity modification on its own.  I think that continuing to work on his range of motion and rotator cuff strengthening scapular stabilization would be the primary thing to do here.  His range of motion on the left equals that of the contralateral side, while both sides are slightly less than would be average.  Rotator cuff strengthening and scapular stabilization exercises were reviewed with the patient.  RTC and scapular stabilization and shoulder protocol given to the patient. Retraining shoulder mechanics and function was emphasized to the patient with rehab done ideally 5-6 days a week.   Follow-up: As needed only  Signed,  Kaelen Caughlin T. Fredie Majano, MD   Patient's Medications  New Prescriptions   No medications on file  Previous Medications   BLOOD GLUCOSE MONITORING SUPPL (FREESTYLE FREEDOM LITE) W/DEVICE KIT    USE AS INSTRUCTED TO CHECK BLOOD SUGAR ONCE DAILY   BLOOD GLUCOSE MONITORING SUPPL (FREESTYLE FREEDOM LITE) W/DEVICE KIT    USE AS  DIRECTED TO CHECK BLOOD SUGAR ONCE DAILY   BLOOD GLUCOSE MONITORING SUPPL (ONE TOUCH ULTRA 2) W/DEVICE KIT    Use as instructed to check blood sugar once daily   GLUCOSE BLOOD TEST STRIP    USE AS INSTRUCTED TO CHECK BLOOD SUGAR ONCE DAILY   LANCETS (FREESTYLE) LANCETS    USE AS INSTRUCTED TO CHECK BLOOD SUGAR ONCE DAILY   LEVOTHYROXINE (SYNTHROID) 125  MCG TABLET    TAKE 1 TABLET BY MOUTH DAILY BEFORE BREAKFAST   LISINOPRIL (ZESTRIL) 20 MG TABLET    TAKE 1 TABLET BY MOUTH DAILY.   LOVASTATIN (MEVACOR) 20 MG TABLET    TAKE 1 TABLET BY MOUTH AT BEDTIME.   NAPROXEN (NAPROSYN) 500 MG TABLET    Take one tablet by mouth for 1 week then take as needed for pain, take with food   OMEGA-3 FATTY ACIDS (FISH OIL) 1000 MG CAPS    Take 1 capsule (1,000 mg total) by mouth daily.   ONETOUCH DELICA LANCETS 12J MISC    Use as instructed to check blood sugar once daily   SEMAGLUTIDE,0.25 OR 0.5MG/DOS, 2 MG/1.5ML SOPN    INJECT 0.25 MG INTO THE SKIN ONCE A WEEK FOR 14 DAYS, THEN 0.5 MG ONCE A WEEK.  Modified Medications   No medications on file  Discontinued Medications   No medications on file

## 2021-03-19 ENCOUNTER — Encounter: Payer: Self-pay | Admitting: Family Medicine

## 2021-03-19 ENCOUNTER — Ambulatory Visit (INDEPENDENT_AMBULATORY_CARE_PROVIDER_SITE_OTHER): Payer: No Typology Code available for payment source | Admitting: Family Medicine

## 2021-03-19 ENCOUNTER — Other Ambulatory Visit: Payer: Self-pay

## 2021-03-19 VITALS — BP 130/84 | HR 79 | Temp 98.0°F | Ht 67.75 in | Wt 175.8 lb

## 2021-03-19 DIAGNOSIS — M7582 Other shoulder lesions, left shoulder: Secondary | ICD-10-CM

## 2021-03-19 DIAGNOSIS — M7542 Impingement syndrome of left shoulder: Secondary | ICD-10-CM

## 2021-04-18 LAB — HM DIABETES EYE EXAM

## 2021-04-22 ENCOUNTER — Encounter: Payer: Self-pay | Admitting: Family Medicine

## 2021-05-20 ENCOUNTER — Encounter: Payer: No Typology Code available for payment source | Admitting: Family Medicine

## 2021-06-02 ENCOUNTER — Other Ambulatory Visit: Payer: Self-pay

## 2021-06-02 MED FILL — Lisinopril Tab 20 MG: ORAL | 90 days supply | Qty: 90 | Fill #1 | Status: AC

## 2021-06-04 ENCOUNTER — Encounter: Payer: Self-pay | Admitting: Family Medicine

## 2021-06-04 ENCOUNTER — Other Ambulatory Visit: Payer: Self-pay

## 2021-06-04 ENCOUNTER — Ambulatory Visit (INDEPENDENT_AMBULATORY_CARE_PROVIDER_SITE_OTHER): Payer: No Typology Code available for payment source | Admitting: Family Medicine

## 2021-06-04 VITALS — BP 132/86 | HR 70 | Temp 97.5°F | Ht 67.5 in | Wt 172.3 lb

## 2021-06-04 DIAGNOSIS — E1169 Type 2 diabetes mellitus with other specified complication: Secondary | ICD-10-CM

## 2021-06-04 DIAGNOSIS — E785 Hyperlipidemia, unspecified: Secondary | ICD-10-CM | POA: Diagnosis not present

## 2021-06-04 DIAGNOSIS — I10 Essential (primary) hypertension: Secondary | ICD-10-CM

## 2021-06-04 DIAGNOSIS — E118 Type 2 diabetes mellitus with unspecified complications: Secondary | ICD-10-CM

## 2021-06-04 DIAGNOSIS — E039 Hypothyroidism, unspecified: Secondary | ICD-10-CM

## 2021-06-04 DIAGNOSIS — Z125 Encounter for screening for malignant neoplasm of prostate: Secondary | ICD-10-CM | POA: Diagnosis not present

## 2021-06-04 DIAGNOSIS — Z0000 Encounter for general adult medical examination without abnormal findings: Secondary | ICD-10-CM | POA: Diagnosis not present

## 2021-06-04 LAB — COMPREHENSIVE METABOLIC PANEL
ALT: 27 U/L (ref 0–53)
AST: 21 U/L (ref 0–37)
Albumin: 4.5 g/dL (ref 3.5–5.2)
Alkaline Phosphatase: 44 U/L (ref 39–117)
BUN: 17 mg/dL (ref 6–23)
CO2: 25 mEq/L (ref 19–32)
Calcium: 9.6 mg/dL (ref 8.4–10.5)
Chloride: 107 mEq/L (ref 96–112)
Creatinine, Ser: 0.99 mg/dL (ref 0.40–1.50)
GFR: 85.44 mL/min (ref >60.00)
Glucose, Bld: 97 mg/dL (ref 70–99)
Potassium: 4.3 mEq/L (ref 3.5–5.1)
Sodium: 138 mEq/L (ref 135–145)
Total Bilirubin: 0.5 mg/dL (ref 0.2–1.2)
Total Protein: 7.3 g/dL (ref 6.0–8.3)

## 2021-06-04 LAB — LIPID PANEL
Cholesterol: 173 mg/dL (ref 0–200)
HDL: 30.3 mg/dL — ABNORMAL LOW (ref >39.00)
LDL Cholesterol: 126 mg/dL — ABNORMAL HIGH (ref 0–99)
NonHDL: 142.63
Total CHOL/HDL Ratio: 6
Triglycerides: 81 mg/dL (ref 0.0–149.0)
VLDL: 16.2 mg/dL (ref 0.0–40.0)

## 2021-06-04 LAB — POCT GLYCOSYLATED HEMOGLOBIN (HGB A1C): Hemoglobin A1C: 6 % — AB (ref 4.0–5.6)

## 2021-06-04 LAB — TSH: TSH: 3.69 u[IU]/mL (ref 0.35–5.50)

## 2021-06-04 LAB — PSA: PSA: 2 ng/mL (ref 0.10–4.00)

## 2021-06-04 MED ORDER — LEVOTHYROXINE SODIUM 125 MCG PO TABS
ORAL_TABLET | Freq: Every day | ORAL | 3 refills | Status: DC
  Filled 2021-06-04: qty 90, 90d supply, fill #0
  Filled 2021-10-21: qty 90, 90d supply, fill #1
  Filled 2022-03-06: qty 90, 90d supply, fill #2
  Filled 2022-06-02: qty 90, 90d supply, fill #3

## 2021-06-04 MED ORDER — LISINOPRIL 20 MG PO TABS
ORAL_TABLET | Freq: Every day | ORAL | 3 refills | Status: DC
  Filled 2021-09-01: qty 90, 90d supply, fill #0
  Filled 2021-12-18: qty 90, 90d supply, fill #1
  Filled 2022-03-06: qty 90, 90d supply, fill #2
  Filled 2022-06-02: qty 90, 90d supply, fill #3

## 2021-06-04 MED ORDER — LOVASTATIN 20 MG PO TABS
ORAL_TABLET | Freq: Every day | ORAL | 3 refills | Status: DC
  Filled 2021-06-04: qty 90, 90d supply, fill #0

## 2021-06-04 MED ORDER — SEMAGLUTIDE(0.25 OR 0.5MG/DOS) 2 MG/1.5ML ~~LOC~~ SOPN
PEN_INJECTOR | SUBCUTANEOUS | 11 refills | Status: DC
  Filled 2021-06-04: qty 1.5, 28d supply, fill #0
  Filled 2021-09-02 – 2021-10-10 (×2): qty 1.5, 28d supply, fill #1
  Filled 2021-12-19: qty 1.5, 28d supply, fill #2
  Filled 2022-02-23: qty 1.5, 28d supply, fill #3

## 2021-06-04 NOTE — Progress Notes (Signed)
Patient ID: Jonathan Thomas, male    DOB: July 31, 1966, 55 y.o.   MRN: [id number]  This visit was conducted in person.  BP 132/86   Pulse 70   Temp (!) 97.5 F (36.4 C) (Temporal)   Ht 5' 7.5" (1.715 m)   Wt 172 lb 5 oz (78.2 kg)   SpO2 97%   BMI 26.59 kg/m    CC: CPE Subjective:   HPI: Jonathan Thomas is a 55 y.o. male presenting on 06/04/2021 for Annual Exam   DM - sugars well controlled at home. Uses freestyle meter, 80-100. No hypoglycemia. Only on ozempic 0.14m weekly. Weight loss noted.  Had coffee with teaspoon monk fruit   Preventative: Colonoscopy 11/2015 - [facility], rpt 10 yrs ([facility])  Prostate cancer screening - yearly  Lung cancer screening - not eligible  Flu shot - declined  [place], 05/2020 Tetanus shot - thinks within 10 yrs - declines  Shingrix - discussed, declines Seat belt use discussed Sunscreen use discussed, no changing moles on skin  Ex smoker quit remotely [place] - social  Dentist yearly  Eye exam yearly    Lives with wife, daughter Occ: mDealerEdu: HS Activity: gym 3d/wk, enjoys 4 wheel riding, walking 5d/wk  Diet: good water, vegetables some     Relevant past medical, surgical, family and social history reviewed and updated as indicated. Interim medical history since our last visit reviewed. Allergies and medications reviewed and updated. Outpatient Medications Prior to Visit  Medication Sig Dispense Refill   Blood Glucose Monitoring Suppl (FREESTYLE FREEDOM LITE) w/Device KIT USE AS INSTRUCTED TO CHECK BLOOD SUGAR ONCE DAILY 1 kit 0   glucose blood test strip USE AS INSTRUCTED TO CHECK BLOOD SUGAR ONCE DAILY (Patient taking differently: USE AS INSTRUCTED TO CHECK BLOOD SUGAR ONCE DAILY) 100 strip 1   Lancets (FREESTYLE) lancets USE AS INSTRUCTED TO CHECK BLOOD SUGAR ONCE DAILY (Patient taking differently: USE AS INSTRUCTED TO CHECK BLOOD SUGAR ONCE DAILY) 100 each 1   naproxen (NAPROSYN) 500 MG tablet Take one  tablet by mouth for 1 week then take as needed for pain, take with food 40 tablet 0   Omega-3 Fatty Acids (FISH OIL) 1000 MG CAPS Take 1 capsule (1,000 mg total) by mouth daily.  0   OneTouch Delica Lancets 325ZMISC Use as instructed to check blood sugar once daily 100 each 1   Blood Glucose Monitoring Suppl (FREESTYLE FREEDOM LITE) w/Device KIT USE AS DIRECTED TO CHECK BLOOD SUGAR ONCE DAILY 1 kit 0   Blood Glucose Monitoring Suppl (ONE TOUCH ULTRA 2) w/Device KIT Use as instructed to check blood sugar once daily 1 kit 0   levothyroxine (SYNTHROID) 125 MCG tablet TAKE 1 TABLET BY MOUTH DAILY BEFORE BREAKFAST 90 tablet 3   lisinopril (ZESTRIL) 20 MG tablet TAKE 1 TABLET BY MOUTH DAILY. 90 tablet 3   lovastatin (MEVACOR) 20 MG tablet TAKE 1 TABLET BY MOUTH AT BEDTIME. 90 tablet 3   Semaglutide,0.25 or 0.5MG/DOS, 2 MG/1.5ML SOPN INJECT 0.25 MG INTO THE SKIN ONCE A WEEK FOR 14 DAYS, THEN 0.5 MG ONCE A WEEK. 1.5 mL 6   No facility-administered medications prior to visit.     Per HPI unless specifically indicated in ROS section below Review of Systems  Constitutional:  Negative for activity change, appetite change, chills, fatigue, fever and unexpected weight change.  HENT:  Negative for hearing loss.   Eyes:  Negative for visual disturbance.  Respiratory:  Negative for cough, chest tightness, shortness of breath and wheezing.   Cardiovascular:  Negative for chest pain, palpitations and leg swelling.  Gastrointestinal:  Positive for blood in stool (hemorrhoid related). Negative for abdominal distention, abdominal pain, constipation, diarrhea, nausea and vomiting.  Genitourinary:  Negative for difficulty urinating and hematuria.  Musculoskeletal:  Negative for arthralgias, myalgias and neck pain.  Skin:  Negative for rash.  Neurological:  Negative for dizziness, seizures, syncope and headaches.  Hematological:  Negative for adenopathy. Does not bruise/bleed easily.  Psychiatric/Behavioral:   Negative for dysphoric mood. The patient is not nervous/anxious.    Objective:  BP 132/86   Pulse 70   Temp (!) 97.5 F (36.4 C) (Temporal)   Ht 5' 7.5" (1.715 m)   Wt 172 lb 5 oz (78.2 kg)   SpO2 97%   BMI 26.59 kg/m   Wt Readings from Last 3 Encounters:  06/04/21 172 lb 5 oz (78.2 kg)  03/19/21 175 lb 12 oz (79.7 kg)  02/28/21 181 lb 1 oz (82.1 kg)      Physical Exam Vitals and nursing note reviewed.  Constitutional:      General: He is not in acute distress.    Appearance: Normal appearance. He is well-developed. He is not ill-appearing.  HENT:     Head: Normocephalic and atraumatic.     Right Ear: Hearing, tympanic membrane, ear canal and external ear normal.     Left Ear: Hearing, tympanic membrane, ear canal and external ear normal.  Eyes:     General: No scleral icterus.    Extraocular Movements: Extraocular movements intact.     Conjunctiva/sclera: Conjunctivae normal.     Pupils: Pupils are equal, round, and reactive to light.  Neck:     Thyroid: No thyroid mass or thyromegaly.  Cardiovascular:     Rate and Rhythm: Normal rate and regular rhythm.     Pulses: Normal pulses.          Radial pulses are 2+ on the right side and 2+ on the left side.     Heart sounds: Normal heart sounds. No murmur heard. Pulmonary:     Effort: Pulmonary effort is normal. No respiratory distress.     Breath sounds: Normal breath sounds. No wheezing, rhonchi or rales.  Abdominal:     General: Bowel sounds are normal. There is no distension.     Palpations: Abdomen is soft. There is no mass.     Tenderness: There is no abdominal tenderness. There is no guarding or rebound.     Hernia: No hernia is present.  Musculoskeletal:        General: Normal range of motion.     Cervical back: Normal range of motion and neck supple.     Right lower leg: No edema.     Left lower leg: No edema.  Lymphadenopathy:     Cervical: No cervical adenopathy.  Skin:    General: Skin is warm and dry.      Findings: No rash.  Neurological:     General: No focal deficit present.     Mental Status: He is alert and oriented to person, place, and time.  Psychiatric:        Mood and Affect: Mood normal.        Behavior: Behavior normal.        Thought Content: Thought content normal.        Judgment: Judgment normal.      Results for orders placed or performed  in visit on 06/04/21  POCT glycosylated hemoglobin (Hb A1C)  Result Value Ref Range   Hemoglobin A1C 6.0 (A) 4.0 - 5.6 %   HbA1c POC (<> result, manual entry)     HbA1c, POC (prediabetic range)     HbA1c, POC (controlled diabetic range)      Assessment & Plan:  This visit occurred during the SARS-CoV-2 public health emergency.  Safety protocols were in place, including screening questions prior to the visit, additional usage of staff PPE, and extensive cleaning of exam room while observing appropriate contact time as indicated for disinfecting solutions.   Problem List Items Addressed This Visit     Controlled diabetes mellitus type 2 with complications ([place])    Congratulated on great control to date - continue ozempic. RTC 6 mo DM f/u visit.       Relevant Medications   Semaglutide,0.25 or 0.5MG/DOS, 2 MG/1.5ML SOPN   lisinopril (ZESTRIL) 20 MG tablet   lovastatin (MEVACOR) 20 MG tablet   Other Relevant Orders   POCT glycosylated hemoglobin (Hb A1C) (Completed)   Essential hypertension    Chronic, stable. Continue lisinopril 33m daily.       Relevant Medications   lisinopril (ZESTRIL) 20 MG tablet   lovastatin (MEVACOR) 20 MG tablet   Hyperlipidemia associated with type 2 diabetes mellitus (HCC)    Continues lovastatin 251mdaily - update FLP. The 10-year ASCVD risk score (GMikey BussingC JrBrooke Bonito et al., 2013) is: 24.6%   Values used to calculate the score:     Age: [age]ears     Sex: Male     Is Non-Hispanic African American: Yes     Diabetic: Yes     Tobacco smoker: No     Systolic Blood Pressure: 13707mHg     Is BP  treated: Yes     HDL Cholesterol: 32.2 mg/dL     Total Cholesterol: 203 mg/dL       Relevant Medications   Semaglutide,0.25 or 0.5MG/DOS, 2 MG/1.5ML SOPN   lisinopril (ZESTRIL) 20 MG tablet   lovastatin (MEVACOR) 20 MG tablet   Other Relevant Orders   Lipid panel   Comprehensive metabolic panel   Hypothyroidism    Update TSH.       Relevant Medications   levothyroxine (SYNTHROID) 125 MCG tablet   Other Relevant Orders   TSH   Health maintenance examination - Primary    Preventative protocols reviewed and updated unless pt declined. Discussed healthy diet and lifestyle.       Other Visit Diagnoses     Special screening for malignant neoplasm of prostate       Relevant Orders   PSA        Meds ordered this encounter  Medications   Semaglutide,0.25 or 0.5MG/DOS, 2 MG/1.5ML SOPN    Sig: INJECT 0.25 MG INTO THE SKIN ONCE A WEEK FOR 14 DAYS, THEN 0.5 MG ONCE A WEEK.    Dispense:  1.5 mL    Refill:  11   levothyroxine (SYNTHROID) 125 MCG tablet    Sig: TAKE 1 TABLET BY MOUTH DAILY BEFORE BREAKFAST    Dispense:  90 tablet    Refill:  3   lisinopril (ZESTRIL) 20 MG tablet    Sig: TAKE 1 TABLET BY MOUTH DAILY.    Dispense:  90 tablet    Refill:  3   lovastatin (MEVACOR) 20 MG tablet    Sig: TAKE 1 TABLET BY MOUTH AT BEDTIME.    Dispense:  90 tablet  Refill:  3   Orders Placed This Encounter  Procedures   Lipid panel   Comprehensive metabolic panel   TSH   PSA   POCT glycosylated hemoglobin (Hb A1C)     Patient instructions: Labs today  Consider shingles shots and pneumonia shot as you're eligible fore both.  You are doing great today! Return as needed or in 6 months for diabetes check.   Follow up plan: Return in about 6 months (around 12/02/2021) for follow up visit.  Ria Bush, MD

## 2021-06-04 NOTE — Assessment & Plan Note (Signed)
Preventative protocols reviewed and updated unless pt declined. Discussed healthy diet and lifestyle.  

## 2021-06-04 NOTE — Patient Instructions (Addendum)
Labs today  Consider shingles shots and pneumonia shot as you're eligible fore both.  You are doing great today! Return as needed or in 6 months for diabetes check.   Health Maintenance, Male Adopting a healthy lifestyle and getting preventive care are important in promoting health and wellness. Ask your health care provider about: The right schedule for you to have regular tests and exams. Things you can do on your own to prevent diseases and keep yourself healthy. What should I know about diet, weight, and exercise? Eat a healthy diet  Eat a diet that includes plenty of vegetables, fruits, low-fat dairy products, and lean protein. Do not eat a lot of foods that are high in solid fats, added sugars, or sodium. Maintain a healthy weight Body mass index (BMI) is a measurement that can be used to identify possible weight problems. It estimates body fat based on height and weight. Your health care provider can help determine your BMI and help you achieve or maintain a healthy weight. Get regular exercise Get regular exercise. This is one of the most important things you can do for your health. Most adults should: Exercise for at least 150 minutes each week. The exercise should increase your heart rate and make you sweat (moderate-intensity exercise). Do strengthening exercises at least twice a week. This is in addition to the moderate-intensity exercise. Spend less time sitting. Even light physical activity can be beneficial. Watch cholesterol and blood lipids Have your blood tested for lipids and cholesterol at 56 years of age, then have this test every 5 years. You may need to have your cholesterol levels checked more often if: Your lipid or cholesterol levels are high. You are older than 56 years of age. You are at high risk for heart disease. What should I know about cancer screening? Many types of cancers can be detected early and may often be prevented. Depending on your health history  and family history, you may need to have cancer screening at various ages. This may include screening for: Colorectal cancer. Prostate cancer. Skin cancer. Lung cancer. What should I know about heart disease, diabetes, and high blood pressure? Blood pressure and heart disease High blood pressure causes heart disease and increases the risk of stroke. This is more likely to develop in people who have high blood pressure readings, are of African descent, or are overweight. Talk with your health care provider about your target blood pressure readings. Have your blood pressure checked: Every 3-5 years if you are 56-54 years of age. Every year if you are 47 years old or older. If you are between the ages of 5 and 17 and are a current or former smoker, ask your health care provider if you should have a one-time screening for abdominal aortic aneurysm (AAA). Diabetes Have regular diabetes screenings. This checks your fasting blood sugar level. Have the screening done: Once every three years after age 11 if you are at a normal weight and have a low risk for diabetes. More often and at a younger age if you are overweight or have a high risk for diabetes. What should I know about preventing infection? Hepatitis B If you have a higher risk for hepatitis B, you should be screened for this virus. Talk with your health care provider to find out if you are at risk for hepatitis B infection. Hepatitis C Blood testing is recommended for: Everyone born from [birth year] through 1966. Anyone with known risk factors for hepatitis C. Sexually transmitted infections (  STIs) You should be screened each year for STIs, including gonorrhea and chlamydia, if: You are sexually active and are younger than 56 years of age. You are older than 56 years of age and your health care provider tells you that you are at risk for this type of infection. Your sexual activity has changed since you were last screened, and you are at  increased risk for chlamydia or gonorrhea. Ask your health care provider if you are at risk. Ask your health care provider about whether you are at high risk for HIV. Your health care provider may recommend a prescription medicine to help prevent HIV infection. If you choose to take medicine to prevent HIV, you should first get tested for HIV. You should then be tested every 3 months for as long as you are taking the medicine. Follow these instructions at home: Lifestyle Do not use any products that contain nicotine or tobacco, such as cigarettes, e-cigarettes, and chewing tobacco. If you need help quitting, ask your health care provider. Do not use street drugs. Do not share needles. Ask your health care provider for help if you need support or information about quitting drugs. Alcohol use Do not drink alcohol if your health care provider tells you not to drink. If you drink alcohol: Limit how much you have to 0-2 drinks a day. Be aware of how much alcohol is in your drink. In the [place] one drink equals one 12 oz bottle of beer (355 mL), one 5 oz glass of wine (148 mL), or one 1 oz glass of hard liquor (44 mL). General instructions Schedule regular health, dental, and eye exams. Stay current with your vaccines. Tell your health care provider if: You often feel depressed. You have ever been abused or do not feel safe at home. Summary Adopting a healthy lifestyle and getting preventive care are important in promoting health and wellness. Follow your health care provider's instructions about healthy diet, exercising, and getting tested or screened for diseases. Follow your health care provider's instructions on monitoring your cholesterol and blood pressure. This information is not intended to replace advice given to you by your health care provider. Make sure you discuss any questions you have with your health care provider. Document Revised: 11/29/2020 Document Reviewed: 09/14/2018 Elsevier  Patient Education  2022 [employer].

## 2021-06-04 NOTE — Assessment & Plan Note (Signed)
Continues lovastatin '20mg'$  daily - update FLP. The 10-year ASCVD risk score Mikey Bussing DC Brooke Bonito., et al., 2013) is: 24.6%   Values used to calculate the score:     Age: 55 years     Sex: Male     Is Non-Hispanic African American: Yes     Diabetic: Yes     Tobacco smoker: No     Systolic Blood Pressure: [id number] mmHg     Is BP treated: Yes     HDL Cholesterol: 32.2 mg/dL     Total Cholesterol: 203 mg/dL

## 2021-06-04 NOTE — Assessment & Plan Note (Signed)
Congratulated on great control to date - continue ozempic. RTC 6 mo DM f/u visit.

## 2021-06-04 NOTE — Assessment & Plan Note (Signed)
Chronic, stable. Continue lisinopril '20mg'$  daily.

## 2021-06-04 NOTE — Assessment & Plan Note (Signed)
Update TSH

## 2021-06-07 ENCOUNTER — Other Ambulatory Visit: Payer: Self-pay | Admitting: Family Medicine

## 2021-06-07 MED ORDER — LOVASTATIN 40 MG PO TABS
40.0000 mg | ORAL_TABLET | Freq: Every day | ORAL | 3 refills | Status: DC
  Filled 2021-06-07: qty 90, 90d supply, fill #0

## 2021-06-09 ENCOUNTER — Other Ambulatory Visit: Payer: Self-pay

## 2021-06-10 ENCOUNTER — Other Ambulatory Visit: Payer: Self-pay

## 2021-06-26 ENCOUNTER — Other Ambulatory Visit: Payer: Self-pay

## 2021-07-24 ENCOUNTER — Ambulatory Visit (INDEPENDENT_AMBULATORY_CARE_PROVIDER_SITE_OTHER): Payer: No Typology Code available for payment source | Admitting: Orthopaedic Surgery

## 2021-07-24 ENCOUNTER — Ambulatory Visit: Payer: Self-pay

## 2021-07-24 ENCOUNTER — Other Ambulatory Visit: Payer: Self-pay

## 2021-07-24 ENCOUNTER — Encounter: Payer: Self-pay | Admitting: Orthopaedic Surgery

## 2021-07-24 DIAGNOSIS — M25512 Pain in left shoulder: Secondary | ICD-10-CM | POA: Diagnosis not present

## 2021-07-24 DIAGNOSIS — G8929 Other chronic pain: Secondary | ICD-10-CM

## 2021-07-24 NOTE — Progress Notes (Signed)
Office Visit Note   Patient: Jonathan Thomas           Date of Birth: 1966/01/09           MRN: [id number] Visit Date: 07/24/2021              Requested by: Ria Bush, MD [address],  [place] [address] PCP: Ria Bush, MD   Assessment & Plan: Visit Diagnoses:  1. Chronic left shoulder pain     Plan: [name] has been experiencing left shoulder pain for at least several months and probably longer.  Denies any history of injury or trauma.  He writes left-handed but uses his right hand for many other activities.  He has had some compromise of his activities.  Films demonstrate what appears to be primary osteoarthritis of the left glenohumeral joint with narrowing of the joint space and inferior humeral head osteophytes.  Long discussion regarding the diagnosis of what he may expect over time.  I discussed use of over-the-counter medicines and exercises.  We will try a course of physical therapy to help strengthen the shoulder and provide increased motion.  Have discussed limited use of intra-articular cortisone injections and even total shoulder replacement over time.  He is not ready for that  Follow-Up Instructions: Return if symptoms worsen or fail to improve.   Orders:  Orders Placed This Encounter  Procedures   XR Shoulder Left   No orders of the defined types were placed in this encounter.     Procedures: No procedures performed   Clinical Data: No additional findings.   Subjective: Chief Complaint  Patient presents with   Left Shoulder - Pain  Patient presents today for left shoulder pain. He said that it started to hurt a few months ago while playing pool. His pain is located anteriorly. He is unable to fully lift his arm. He has numbness in his fingers. He has been evaluated before today with another physician and was told he had a frozen shoulder. He has been doing home exercises. He takes Tylenol if needed. He is left hand  dominant.  Not experiencing any neck pain  HPI  Review of Systems   Objective: Vital Signs: There were no vitals taken for this visit.  Physical Exam Constitutional:      Appearance: He is well-developed.  Eyes:     Pupils: Pupils are equal, round, and reactive to light.  Pulmonary:     Effort: Pulmonary effort is normal.  Skin:    General: Skin is warm and dry.  Neurological:     Mental Status: He is alert and oriented to person, place, and time.  Psychiatric:        Behavior: Behavior normal.    Ortho Exam able to place left arm overhead but lacked at least 20 to 30 degrees of full overhead motion and did have some limitation of internal rotation as well.  There was no crepitation but he was experiencing discomfort along the glenohumeral joint anteriorly and to a lesser extent posteriorly.  He could abduct 90 degrees.  No pain at the [facility] joint.  Skin intact.  Good grip and release.  Good strength  Specialty Comments:  No specialty comments available.  Imaging: XR Shoulder Left  Result Date: 07/24/2021 Films of the left shoulder obtained in 3 projections.  There is narrowing of the anterior glenohumeral joint space with small subchondral cysts and inferior humeral head osteophytes consistent with primary osteoarthritis.  Normal  space between the humeral head and the acromion.  Some mild degenerative changes at the [facility] [place] joint.  No ectopic calcification or acute change    PMFS History: Patient Active Problem List   Diagnosis Date Noted   Left shoulder pain 02/28/2021   Health maintenance examination 04/17/2019   Right lower quadrant abdominal pain 04/17/2019   Essential hypertension 10/31/2018   Hyperlipidemia associated with type 2 diabetes mellitus ([place]) 10/31/2018   Hypothyroidism 10/31/2018   Controlled diabetes mellitus type 2 with complications ([place]) 21/22/4825   Past Medical History:  Diagnosis Date   Diabetes mellitus without complication ([place])    in past - no  meds since 2012   HLD (hyperlipidemia)    Hypertension    Hypothyroidism    Sciatica of left side     Family History  Problem Relation Age of Onset   Lung cancer Father    Thyroid disease Father    Diabetes Mother    Thyroid disease Mother    CAD Paternal Grandfather 11   Stroke Neg Hx     Past Surgical History:  Procedure Laterality Date   COLONOSCOPY WITH PROPOFOL N/A 11/25/2015   Procedure: COLONOSCOPY WITH PROPOFOL;  Surgeon: Lucilla Lame, MD;  Location: [place];  Service: Endoscopy;  Laterality: N/A;   LIPOMA EXCISION     neck ([place])   POLYPECTOMY  11/25/2015   Procedure: POLYPECTOMY INTESTINAL;  Surgeon: Lucilla Lame, MD;  Location: [place];  Service: Endoscopy;;  Descending colon polyp Sigmoid colon polyp Both cold snare.   TONSILLECTOMY     as child   Social History   Occupational History   Not on file  Tobacco Use   Smoking status: Former    Types: Cigars    Quit date: 10/05/2013    Years since quitting: 7.8   Smokeless tobacco: Never   Tobacco comments:    Quit cigars 2015. Quit cigarettes 25+ yrs ago.  Substance and Sexual Activity   Alcohol use: No    Alcohol/week: 0.0 standard drinks   Drug use: No   Sexual activity: Yes

## 2021-07-24 NOTE — Addendum Note (Signed)
Addended by: Lendon Collar on: 07/24/2021 03:42 PM   Modules accepted: Orders

## 2021-07-29 ENCOUNTER — Ambulatory Visit: Payer: No Typology Code available for payment source | Admitting: Physical Therapy

## 2021-09-01 ENCOUNTER — Other Ambulatory Visit: Payer: Self-pay

## 2021-09-03 ENCOUNTER — Other Ambulatory Visit: Payer: Self-pay

## 2021-09-16 ENCOUNTER — Other Ambulatory Visit: Payer: Self-pay

## 2021-10-10 ENCOUNTER — Other Ambulatory Visit: Payer: Self-pay

## 2021-10-21 ENCOUNTER — Other Ambulatory Visit: Payer: Self-pay

## 2021-11-11 ENCOUNTER — Other Ambulatory Visit: Payer: Self-pay

## 2021-11-11 MED FILL — Glucose Blood Test Strip: 90 days supply | Qty: 100 | Fill #0 | Status: AC

## 2021-12-18 ENCOUNTER — Other Ambulatory Visit: Payer: Self-pay

## 2021-12-19 ENCOUNTER — Other Ambulatory Visit: Payer: Self-pay

## 2022-01-12 ENCOUNTER — Telehealth: Payer: Self-pay

## 2022-01-12 NOTE — Telephone Encounter (Signed)
Pt is having pain in his large toe. Dr Darnell Level has approved a work in Architectural technologist at 49. ?

## 2022-01-13 ENCOUNTER — Ambulatory Visit: Payer: No Typology Code available for payment source | Admitting: Family Medicine

## 2022-02-12 ENCOUNTER — Other Ambulatory Visit: Payer: Self-pay

## 2022-02-12 ENCOUNTER — Ambulatory Visit: Payer: No Typology Code available for payment source | Admitting: Orthopaedic Surgery

## 2022-02-12 MED ORDER — MELOXICAM 15 MG PO TABS
ORAL_TABLET | ORAL | 2 refills | Status: DC
  Filled 2022-02-12: qty 30, 30d supply, fill #0

## 2022-02-24 ENCOUNTER — Other Ambulatory Visit: Payer: Self-pay

## 2022-02-24 MED ORDER — OZEMPIC (0.25 OR 0.5 MG/DOSE) 2 MG/3ML ~~LOC~~ SOPN
PEN_INJECTOR | SUBCUTANEOUS | 8 refills | Status: DC
  Filled 2022-02-24: qty 3, 28d supply, fill #0
  Filled 2022-04-15: qty 3, 28d supply, fill #1
  Filled 2022-06-02: qty 9, 84d supply, fill #1

## 2022-03-06 ENCOUNTER — Other Ambulatory Visit: Payer: Self-pay

## 2022-03-18 ENCOUNTER — Telehealth: Payer: Self-pay

## 2022-03-18 DIAGNOSIS — E039 Hypothyroidism, unspecified: Secondary | ICD-10-CM

## 2022-03-18 NOTE — Addendum Note (Signed)
Addended by: Ria Bush on: 03/18/2022 05:08 PM   Modules accepted: Orders

## 2022-03-18 NOTE — Telephone Encounter (Signed)
Received fax letter from [facility] stating the levothyroxine manufacturer is changing from [place] to [place]. Placed letter in Dr. Synthia Innocent box.

## 2022-03-18 NOTE — Telephone Encounter (Signed)
Okay to do okay by patient. Please have patient come in 4 to 6 weeks after starting new manufacture thyroid medicine check thyroid function which was ordered.

## 2022-03-19 ENCOUNTER — Other Ambulatory Visit: Payer: Self-pay

## 2022-03-19 NOTE — Telephone Encounter (Signed)
Spoke with pt relaying Dr. Synthia Innocent message.  Pt verbalizes understanding stating he started new manufacturer last week.  Scheduled lab visit on 04/13/22 at 8:25.

## 2022-03-26 ENCOUNTER — Other Ambulatory Visit: Payer: Self-pay

## 2022-03-26 ENCOUNTER — Ambulatory Visit (INDEPENDENT_AMBULATORY_CARE_PROVIDER_SITE_OTHER): Payer: No Typology Code available for payment source | Admitting: Family

## 2022-03-26 ENCOUNTER — Encounter: Payer: Self-pay | Admitting: Family

## 2022-03-26 VITALS — BP 132/90 | HR 70 | Temp 98.3°F | Resp 16 | Ht 67.5 in | Wt 181.0 lb

## 2022-03-26 DIAGNOSIS — H6591 Unspecified nonsuppurative otitis media, right ear: Secondary | ICD-10-CM | POA: Diagnosis not present

## 2022-03-26 DIAGNOSIS — H9311 Tinnitus, right ear: Secondary | ICD-10-CM | POA: Insufficient documentation

## 2022-03-26 DIAGNOSIS — J301 Allergic rhinitis due to pollen: Secondary | ICD-10-CM | POA: Diagnosis not present

## 2022-03-26 MED ORDER — AMOXICILLIN-POT CLAVULANATE 875-125 MG PO TABS
1.0000 | ORAL_TABLET | Freq: Two times a day (BID) | ORAL | 0 refills | Status: DC
Start: 2022-03-26 — End: 2022-04-06
  Filled 2022-03-26: qty 20, 10d supply, fill #0

## 2022-03-26 NOTE — Assessment & Plan Note (Signed)
Start flonase 50 mcg once daily 

## 2022-03-26 NOTE — Patient Instructions (Signed)
Antibiotic sent to preferred pharmacy.  Start flonase daily.   Please increase oral fluids, steamy hot shower/humidifier prn.  Please follow up if no improvement in 2-3 days.   It was a pleasure seeing you today! Please do not hesitate to reach out with any questions and or concerns.  Regards,   Eugenia Pancoast

## 2022-03-26 NOTE — Progress Notes (Signed)
Established Patient Office Visit  Subjective:  Patient ID: Jonathan Thomas, male    DOB: [birth year]  Age: 56 y.o. MRN: [id number]  CC:  Chief Complaint  Patient presents with   Tinnitus    Right ear X 1 week    HPI Jonathan Thomas is here today with concerns.   Right ear with ringing and buzzing, almost constantly. Sounds are muffled.  No pain in the ear.  No nasal congestion, sinus pressure earlier this week slightly better.  No cough or chest congestion.  No fever or chills.  No sore throat.   Past Medical History:  Diagnosis Date   Diabetes mellitus without complication ([place])    in past - no meds since 2012   HLD (hyperlipidemia)    Hypertension    Hypothyroidism    Sciatica of left side     Past Surgical History:  Procedure Laterality Date   COLONOSCOPY WITH PROPOFOL N/A 11/25/2015   Procedure: COLONOSCOPY WITH PROPOFOL;  Surgeon: Lucilla Lame, MD;  Location: [facility];  Service: Endoscopy;  Laterality: N/A;   LIPOMA EXCISION     neck ([place])   POLYPECTOMY  11/25/2015   Procedure: POLYPECTOMY INTESTINAL;  Surgeon: Lucilla Lame, MD;  Location: [facility];  Service: Endoscopy;;  Descending colon polyp Sigmoid colon polyp Both cold snare.   TONSILLECTOMY     as child    Family History  Problem Relation Age of Onset   Lung cancer Father    Thyroid disease Father    Diabetes Mother    Thyroid disease Mother    CAD Paternal Grandfather 34   Stroke Neg Hx     Social History   Socioeconomic History   Marital status: Married    Spouse name: Not on file   Number of children: Not on file   Years of education: Not on file   Highest education level: Not on file  Occupational History   Not on file  Tobacco Use   Smoking status: Former    Types: Cigars    Quit date: 10/05/2013    Years since quitting: 8.4   Smokeless tobacco: Never   Tobacco comments:    Quit cigars 2015. Quit cigarettes 25+ yrs ago.  Substance and Sexual Activity    Alcohol use: No    Alcohol/week: 0.0 standard drinks of alcohol   Drug use: No   Sexual activity: Yes  Other Topics Concern   Not on file  Social History Narrative   Lives with wife, daughter   Occ: Dealer   Edu: HS   Activity: gym 3d/wk, enjoys 4 wheel riding, walking 5d/wk   Diet: good water, vegetables some   Social Determinants of Radio broadcast assistant Strain: Not on file  Food Insecurity: Not on file  Transportation Needs: Not on file  Physical Activity: Not on file  Stress: Not on file  Social Connections: Not on file  Intimate Partner Violence: Not on file    Outpatient Medications Prior to Visit  Medication Sig Dispense Refill   levothyroxine (SYNTHROID) 125 MCG tablet TAKE 1 TABLET BY MOUTH DAILY BEFORE BREAKFAST 90 tablet 3   lisinopril (ZESTRIL) 20 MG tablet TAKE 1 TABLET BY MOUTH DAILY. 90 tablet 3   lovastatin (MEVACOR) 40 MG tablet Take 1 tablet (40 mg total) by mouth at bedtime. 90 tablet 3   meloxicam (MOBIC) 15 MG tablet Take 1 tablet every day by oral route. 30 tablet 2   naproxen (NAPROSYN) 500 MG  tablet Take one tablet by mouth for 1 week then take as needed for pain, take with food 40 tablet 0   Omega-3 Fatty Acids (FISH OIL) 1000 MG CAPS Take 1 capsule (1,000 mg total) by mouth daily.  0   OneTouch Delica Lancets 85O MISC Use as instructed to check blood sugar once daily 100 each 1   Semaglutide,0.25 or 0.'5MG'$ /DOS, (OZEMPIC, 0.25 OR 0.5 MG/DOSE,) 2 MG/3ML SOPN Inject 0.25 mg intot he skin once a week for 14 days, then 0.5 mg once a week 3 mL 8   No facility-administered medications prior to visit.    Allergies  Allergen Reactions   Metformin Diarrhea    GI upset, diarrhea even at low dose        Objective:    Physical Exam Constitutional:      General: He is awake. He is not in acute distress.    Appearance: Normal appearance. He is not ill-appearing.  HENT:     Right Ear: Ear canal and external ear normal. No swelling or tenderness.  A middle ear effusion is present. Tympanic membrane is erythematous and bulging.     Left Ear: Hearing, tympanic membrane, ear canal and external ear normal.     Nose: Nose normal.     Right Turbinates: Swollen. Not enlarged.     Left Turbinates: Swollen. Not enlarged.     Right Sinus: No maxillary sinus tenderness or frontal sinus tenderness.     Left Sinus: No maxillary sinus tenderness or frontal sinus tenderness.     Mouth/Throat:     Mouth: Mucous membranes are moist.     Pharynx: Posterior oropharyngeal erythema present. No pharyngeal swelling or oropharyngeal exudate.  Eyes:     Extraocular Movements: Extraocular movements intact.     Pupils: Pupils are equal, round, and reactive to light.  Cardiovascular:     Rate and Rhythm: Normal rate and regular rhythm.  Pulmonary:     Effort: Pulmonary effort is normal.     Breath sounds: Normal breath sounds. No wheezing.  Neurological:     Mental Status: He is alert.     BP 132/90   Pulse 70   Temp 98.3 F (36.8 C)   Resp 16   Ht 5' 7.5" (1.715 m)   Wt 181 lb (82.1 kg)   SpO2 98%   BMI 27.93 kg/m  Wt Readings from Last 3 Encounters:  03/26/22 181 lb (82.1 kg)  06/04/21 172 lb 5 oz (78.2 kg)  03/19/21 175 lb 12 oz (79.7 kg)     Health Maintenance Due  Topic Date Due   HIV Screening  Never done   Hepatitis C Screening  Never done   TETANUS/TDAP  Never done   Zoster Vaccines- Shingrix (1 of 2) Never done   COVID-19 Vaccine (3 - Pfizer series) 06/30/2020   FOOT EXAM  11/20/2021   HEMOGLOBIN A1C  12/02/2021    There are no preventive care reminders to display for this patient.  Lab Results  Component Value Date   TSH 3.69 06/04/2021   No results found for: "WBC", "HGB", "HCT", "MCV", "PLT" Lab Results  Component Value Date   NA 138 06/04/2021   K 4.3 06/04/2021   CO2 25 06/04/2021   GLUCOSE 97 06/04/2021   BUN 17 06/04/2021   CREATININE 0.99 06/04/2021   BILITOT 0.5 06/04/2021   ALKPHOS 44 06/04/2021   AST  21 06/04/2021   ALT 27 06/04/2021   PROT 7.3 06/04/2021   ALBUMIN  4.5 06/04/2021   CALCIUM 9.6 06/04/2021   GFR 85.44 06/04/2021   Lab Results  Component Value Date   HGBA1C 6.0 (A) 06/04/2021      Assessment & Plan:   Problem List Items Addressed This Visit       Respiratory   Seasonal allergic rhinitis due to pollen    Start flonase 50 mcg once daily         Nervous and Auditory   Right otitis media with effusion - Primary    Take antibiotic as prescribed. Increase oral fluids. Pt to f/u if sx worsen and or fail to improve in 2-3 days. rx augmentin 875/125 mg po bid x 10 days       Relevant Medications   amoxicillin-clavulanate (AUGMENTIN) 875-125 MG tablet    Meds ordered this encounter  Medications   amoxicillin-clavulanate (AUGMENTIN) 875-125 MG tablet    Sig: Take 1 tablet by mouth 2 (two) times daily.    Dispense:  20 tablet    Refill:  0    Order Specific Question:   Supervising Provider    Answer:   BEDSOLE, AMY E [[id number]]    Follow-up: Return if symptoms worsen or fail to improve with pcp.    Eugenia Pancoast, FNP

## 2022-03-26 NOTE — Assessment & Plan Note (Signed)
Take antibiotic as prescribed. Increase oral fluids. Pt to f/u if sx worsen and or fail to improve in 2-3 days. rx augmentin 875/125 mg po bid x 10 days  

## 2022-04-06 ENCOUNTER — Ambulatory Visit (INDEPENDENT_AMBULATORY_CARE_PROVIDER_SITE_OTHER): Payer: No Typology Code available for payment source | Admitting: Family Medicine

## 2022-04-06 ENCOUNTER — Telehealth: Payer: Self-pay

## 2022-04-06 ENCOUNTER — Encounter: Payer: Self-pay | Admitting: Family Medicine

## 2022-04-06 VITALS — BP 156/80 | HR 73 | Temp 97.8°F | Ht 67.5 in | Wt 180.0 lb

## 2022-04-06 DIAGNOSIS — I10 Essential (primary) hypertension: Secondary | ICD-10-CM

## 2022-04-06 DIAGNOSIS — H9311 Tinnitus, right ear: Secondary | ICD-10-CM

## 2022-04-06 NOTE — Assessment & Plan Note (Signed)
R ear tinnitus along with muffled hearing and decreased hearing to low frequencies by hearing screen today. Exam largely reassuring. Anticipate residual diminished hearing from recent otitis media with effusion. Supportive measures reviewed - continue flonase, nasal saline irrigation. Avoid prednisone in diabetic. If no improvement over next 1-2 wks, he will let me know for ENT evaluation.

## 2022-04-06 NOTE — Patient Instructions (Addendum)
Good to see you today. This may be residual effect of previous ear infection. Hearing changes can take 3-4 weeks to improve. Let me know if ongoing past next 1-2 weeks for referral to ENT.  For now, continue flonase, add nasal saline irrigation.  Return for physical after 06/04/2022.   Otitis Media With Effusion, Adult  Otitis media with effusion (OME) is inflammation and fluid (effusion) in the middle ear without having an ear infection. The middle ear is the space behind the eardrum. The middle ear is connected to the back of the throat by a narrow tube (eustachian tube). Normally the eustachian tube drains fluid out of the middle ear. A swollen eustachian tube can become blocked and cause fluid to collect in the middle ear. OME often goes away without treatment. Sometimes OME can lead to hearing problems and recurrent acute ear infections (acute otitis media). These conditions may require treatment. What are the causes? OME is caused by a blocked eustachian tube. This can result from: Allergies. Upper respiratory infections. Enlarged adenoids. The adenoids are areas of soft tissue located high in the back of the throat, behind the nose and the roof of the mouth. They are part of the body's natural defense system (immune system). Rapid changes in pressure, like when an airplane is descending or during scuba diving. In some cases, the cause of this condition is not known. What are the signs or symptoms? Common symptoms of this condition include: A feeling of fullness in your ear. Decreased hearing in the affected ear. Fluid draining into the ear canal. Pain in the ear. In some cases, there are no symptoms. How is this diagnosed?  A health care provider can diagnose OME based on signs and symptoms of the condition. Your provider will also do a physical exam to check for fluid behind the eardrum. During the exam, your health care provider will use an instrument called an otoscope to look in  your ear. Your health care provider may do other tests, such as: A hearing test. A tympanogram. This is a test that shows how well the eardrum moves in response to air pressure in the ear canal. It provides a graph for your health care provider to review. A pneumatic otoscopy. This is a test to check how your eardrum moves in response to changes in pressure. It is done by squeezing a small amount of air into the ear. How is this treated? Treatment for OME depends on the cause of the condition and the severity of symptoms. The first step is often waiting to see if the fluid drains on its own in a few weeks. Home care treatment may include: Over-the-counter pain relievers. A warm, moist cloth placed over the ear. Severe cases may require a procedure to insert tubes in the ears (tympanostomy tubes) to drain the fluid. Follow these instructions at home: Take over-the-counter and prescription medicines only as told by your health care provider. Keep all follow-up visits. Contact a health care provider if: You have pain that gets worse. Hearing in your affected ear gets worse. You have fluid draining from your ear canal. You have dizziness. You develop a fever. Get help right away if: You develop a severe headache. You completely lose hearing in the affected ear. You have bleeding from your ear canal. You have sudden and severe pain in your ear. These symptoms may represent a serious problem that is an emergency. Do not wait to see if the symptoms will go away. Get medical help  right away. Call your local emergency services (911 in the [place] Do not drive yourself to the hospital. Summary Otitis media with effusion (OME) is inflammation and fluid (effusion) in the middle ear without having an ear infection. A swollen eustachian tube can become blocked and cause fluid to collect in the middle ear. Treatment for OME depends on the cause of the condition and the severity of symptoms. Many times,  treatment is not needed because the fluid drains on its own in a few weeks. Sometimes OME can lead to hearing problems and recurrent acute ear infections (acute otitis media), which may require treatment. This information is not intended to replace advice given to you by your health care provider. Make sure you discuss any questions you have with your health care provider. Document Revised: 01/16/2021 Document Reviewed: 01/16/2021 Elsevier Patient Education  [place].

## 2022-04-06 NOTE — Telephone Encounter (Signed)
Patient's wife called in stating that Jonathan Thomas saw Lawerance Bach last week for ear pain, but was not happy with the care. Wanted to know if they can get squeezed in this week for an appointment as the issue is still bothering him and does not want to see anyone else.

## 2022-04-06 NOTE — Assessment & Plan Note (Signed)
BP elevated today despite regular lisinopril use.  No recent decongestant use.  rec monitor BP at home and let me know if consistently >140/90 to titrate antihypertensive.

## 2022-04-06 NOTE — Progress Notes (Signed)
Patient ID: Jonathan Thomas, male    DOB: [birth year], 56 y.o.   MRN: [id number]  This visit was conducted in person.  BP (!) 156/80 (BP Location: Right Arm, Cuff Size: Normal)   Pulse 73   Temp 97.8 F (36.6 C) (Temporal)   Ht 5' 7.5" (1.715 m)   Wt 180 lb (81.6 kg)   SpO2 95%   BMI 27.78 kg/m   Hearing Screening   '500Hz'$  '1000Hz'$  '2000Hz'$  '4000Hz'$   Right ear 0 25 20 40  Left ear '20 20 20 '$ 40    BP 156/90 on retesting  CC: R otitis media  Subjective:   HPI: Jonathan Thomas is a 56 y.o. male presenting on 04/06/2022 for Otitis Media (Here for R ear infection. Seen previously, tx abx- no improvement. )   Saw Tabitha NP on 03/26/2022 with R ear ringing, buzzing, muffled hearing without pain. Diagnosed with R OM with effusion, treated with flonase, augmentin 10d course. No noted improvement on antibiotics.   Ongoing ringing, symptoms ongoing for about 3 wks.  Rhinorrhea resolved on flonase and antibiotic. Some lightheadedness last week.  No ear pain, congestion, fever, headache.  No h/o ear tubes or ear surgery  HTN - Compliant with current antihypertensive regimen of lisinopril '20mg'$  daily. Does check blood pressures at home: better controlled. No low blood pressure readings or symptoms of dizziness/syncope. Denies HA, vision changes, CP/tightness, SOB, leg swelling.    DM - typically well controlled on ozempic 0.'5mg'$  weekly. Home sugars running well controlled.  Lab Results  Component Value Date   HGBA1C 6.0 (A) 06/04/2021     Recent formulary change on levothyroxine (03/18/2022).      Relevant past medical, surgical, family and social history reviewed and updated as indicated. Interim medical history since our last visit reviewed. Allergies and medications reviewed and updated. Outpatient Medications Prior to Visit  Medication Sig Dispense Refill   levothyroxine (SYNTHROID) 125 MCG tablet TAKE 1 TABLET BY MOUTH DAILY BEFORE BREAKFAST 90 tablet 3   lisinopril (ZESTRIL) 20 MG  tablet TAKE 1 TABLET BY MOUTH DAILY. 90 tablet 3   lovastatin (MEVACOR) 40 MG tablet Take 1 tablet (40 mg total) by mouth at bedtime. 90 tablet 3   Omega-3 Fatty Acids (FISH OIL) 1000 MG CAPS Take 1 capsule (1,000 mg total) by mouth daily.  0   OneTouch Delica Lancets 22W MISC Use as instructed to check blood sugar once daily 100 each 1   Semaglutide,0.25 or 0.'5MG'$ /DOS, (OZEMPIC, 0.25 OR 0.5 MG/DOSE,) 2 MG/3ML SOPN Inject 0.25 mg intot he skin once a week for 14 days, then 0.5 mg once a week 3 mL 8   amoxicillin-clavulanate (AUGMENTIN) 875-125 MG tablet Take 1 tablet by mouth 2 (two) times daily. 20 tablet 0   meloxicam (MOBIC) 15 MG tablet Take 1 tablet every day by oral route. 30 tablet 2   naproxen (NAPROSYN) 500 MG tablet Take one tablet by mouth for 1 week then take as needed for pain, take with food 40 tablet 0   No facility-administered medications prior to visit.     Per HPI unless specifically indicated in ROS section below Review of Systems  Objective:  BP (!) 156/80 (BP Location: Right Arm, Cuff Size: Normal)   Pulse 73   Temp 97.8 F (36.6 C) (Temporal)   Ht 5' 7.5" (1.715 m)   Wt 180 lb (81.6 kg)   SpO2 95%   BMI 27.78 kg/m   Wt Readings from Last 3 Encounters:  04/06/22 180 lb (81.6 kg)  03/26/22 181 lb (82.1 kg)  06/04/21 172 lb 5 oz (78.2 kg)      Physical Exam Vitals and nursing note reviewed.  Constitutional:      Appearance: Normal appearance. He is not ill-appearing.  HENT:     Head: Normocephalic and atraumatic.     Right Ear: Hearing, tympanic membrane, ear canal and external ear normal. There is no impacted cerumen.     Left Ear: Hearing, tympanic membrane, ear canal and external ear normal. There is no impacted cerumen.     Ears:     Comments: Ears largely clear, pearly grey TMs without significant fluid    Nose: Congestion present. No mucosal edema or rhinorrhea.     Right Turbinates: Swollen. Not enlarged or pale.     Left Turbinates: Swollen. Not  enlarged or pale.     Right Sinus: No maxillary sinus tenderness or frontal sinus tenderness.     Left Sinus: No maxillary sinus tenderness or frontal sinus tenderness.     Mouth/Throat:     Mouth: Mucous membranes are moist.     Pharynx: Oropharynx is clear. No oropharyngeal exudate or posterior oropharyngeal erythema.  Eyes:     Extraocular Movements: Extraocular movements intact.     Conjunctiva/sclera: Conjunctivae normal.     Pupils: Pupils are equal, round, and reactive to light.  Neck:     Vascular: No carotid bruit.  Musculoskeletal:     Cervical back: Normal range of motion and neck supple. No rigidity.  Lymphadenopathy:     Cervical: No cervical adenopathy.  Skin:    General: Skin is warm and dry.     Findings: No rash.  Neurological:     Mental Status: He is alert.  Psychiatric:        Mood and Affect: Mood normal.        Behavior: Behavior normal.        Assessment & Plan:  He will return for CPE after 06/04/2022.  Problem List Items Addressed This Visit     Essential hypertension    BP elevated today despite regular lisinopril use.  No recent decongestant use.  rec monitor BP at home and let me know if consistently >140/90 to titrate antihypertensive.       Tinnitus of right ear - Primary    R ear tinnitus along with muffled hearing and decreased hearing to low frequencies by hearing screen today. Exam largely reassuring. Anticipate residual diminished hearing from recent otitis media with effusion. Supportive measures reviewed - continue flonase, nasal saline irrigation. Avoid prednisone in diabetic. If no improvement over next 1-2 wks, he will let me know for ENT evaluation.         No orders of the defined types were placed in this encounter.  No orders of the defined types were placed in this encounter.    Patient instructions: Good to see you today. This may be residual effect of previous ear infection. Hearing changes can take 3-4 weeks to  improve. Let me know if ongoing past next 1-2 weeks for referral to ENT.  For now, continue flonase, add nasal saline irrigation.  Return for physical after 06/04/2022.   Follow up plan: No follow-ups on file.  Ria Bush, MD

## 2022-04-06 NOTE — Telephone Encounter (Signed)
Had a cancellation and put patient on for today.

## 2022-04-06 NOTE — Telephone Encounter (Signed)
Noted  

## 2022-04-13 ENCOUNTER — Other Ambulatory Visit (INDEPENDENT_AMBULATORY_CARE_PROVIDER_SITE_OTHER): Payer: No Typology Code available for payment source

## 2022-04-13 DIAGNOSIS — E039 Hypothyroidism, unspecified: Secondary | ICD-10-CM | POA: Diagnosis not present

## 2022-04-13 LAB — TSH: TSH: 1.93 u[IU]/mL (ref 0.35–5.50)

## 2022-04-16 ENCOUNTER — Other Ambulatory Visit: Payer: Self-pay

## 2022-04-24 ENCOUNTER — Encounter: Payer: Self-pay | Admitting: Family Medicine

## 2022-04-24 LAB — HM DIABETES EYE EXAM

## 2022-05-01 ENCOUNTER — Other Ambulatory Visit: Payer: Self-pay

## 2022-06-02 ENCOUNTER — Other Ambulatory Visit: Payer: Self-pay

## 2022-06-03 ENCOUNTER — Other Ambulatory Visit: Payer: Self-pay

## 2022-06-21 ENCOUNTER — Telehealth: Payer: Self-pay | Admitting: Nurse Practitioner

## 2022-06-21 DIAGNOSIS — B9689 Other specified bacterial agents as the cause of diseases classified elsewhere: Secondary | ICD-10-CM

## 2022-06-21 DIAGNOSIS — J329 Chronic sinusitis, unspecified: Secondary | ICD-10-CM

## 2022-06-21 MED ORDER — AMOXICILLIN-POT CLAVULANATE 875-125 MG PO TABS: 1.0000 | ORAL_TABLET | Freq: Two times a day (BID) | ORAL | 0 refills | Status: AC

## 2022-06-21 NOTE — Progress Notes (Signed)
I have spent 5 minutes in review of e-visit questionnaire, review and updating patient chart, medical decision making and response to patient.  ° °Kijana Cromie W Tate Zagal, NP ° °  °

## 2022-06-21 NOTE — Progress Notes (Signed)

## 2022-07-01 ENCOUNTER — Encounter: Payer: No Typology Code available for payment source | Admitting: Family Medicine

## 2022-07-02 ENCOUNTER — Ambulatory Visit: Payer: Self-pay | Admitting: Family Medicine

## 2022-07-08 ENCOUNTER — Ambulatory Visit (INDEPENDENT_AMBULATORY_CARE_PROVIDER_SITE_OTHER): Payer: BC Managed Care – PPO | Admitting: Family Medicine

## 2022-07-08 ENCOUNTER — Other Ambulatory Visit: Payer: Self-pay

## 2022-07-08 ENCOUNTER — Encounter: Payer: Self-pay | Admitting: Family Medicine

## 2022-07-08 VITALS — BP 166/92 | HR 64 | Temp 97.6°F | Ht 67.5 in | Wt 178.1 lb

## 2022-07-08 DIAGNOSIS — E118 Type 2 diabetes mellitus with unspecified complications: Secondary | ICD-10-CM | POA: Diagnosis not present

## 2022-07-08 DIAGNOSIS — Z0000 Encounter for general adult medical examination without abnormal findings: Secondary | ICD-10-CM

## 2022-07-08 DIAGNOSIS — I10 Essential (primary) hypertension: Secondary | ICD-10-CM

## 2022-07-08 DIAGNOSIS — Z125 Encounter for screening for malignant neoplasm of prostate: Secondary | ICD-10-CM | POA: Diagnosis not present

## 2022-07-08 DIAGNOSIS — E785 Hyperlipidemia, unspecified: Secondary | ICD-10-CM | POA: Diagnosis not present

## 2022-07-08 DIAGNOSIS — J019 Acute sinusitis, unspecified: Secondary | ICD-10-CM

## 2022-07-08 DIAGNOSIS — E039 Hypothyroidism, unspecified: Secondary | ICD-10-CM

## 2022-07-08 DIAGNOSIS — J301 Allergic rhinitis due to pollen: Secondary | ICD-10-CM

## 2022-07-08 DIAGNOSIS — E1169 Type 2 diabetes mellitus with other specified complication: Secondary | ICD-10-CM | POA: Diagnosis not present

## 2022-07-08 LAB — LIPID PANEL
Cholesterol: 172 mg/dL (ref 0–200)
HDL: 32.3 mg/dL — ABNORMAL LOW (ref >39.00)
LDL Cholesterol: 119 mg/dL — ABNORMAL HIGH (ref 0–99)
NonHDL: 140.14
Total CHOL/HDL Ratio: 5
Triglycerides: 108 mg/dL (ref 0.0–149.0)
VLDL: 21.6 mg/dL (ref 0.0–40.0)

## 2022-07-08 LAB — COMPREHENSIVE METABOLIC PANEL
ALT: 33 U/L (ref 0–53)
AST: 28 U/L (ref 0–37)
Albumin: 4.7 g/dL (ref 3.5–5.2)
Alkaline Phosphatase: 40 U/L (ref 39–117)
BUN: 14 mg/dL (ref 6–23)
CO2: 27 mEq/L (ref 19–32)
Calcium: 9.6 mg/dL (ref 8.4–10.5)
Chloride: 104 mEq/L (ref 96–112)
Creatinine, Ser: 1.01 mg/dL (ref 0.40–1.50)
GFR: 82.78 mL/min (ref >60.00)
Glucose, Bld: 75 mg/dL (ref 70–99)
Potassium: 4.4 mEq/L (ref 3.5–5.1)
Sodium: 137 mEq/L (ref 135–145)
Total Bilirubin: 0.5 mg/dL (ref 0.2–1.2)
Total Protein: 7.3 g/dL (ref 6.0–8.3)

## 2022-07-08 LAB — PSA: PSA: 1.65 ng/mL (ref 0.10–4.00)

## 2022-07-08 LAB — MICROALBUMIN / CREATININE URINE RATIO
Creatinine,U: 134.3 mg/dL
Microalb Creat Ratio: 0.9 mg/g (ref 0.0–30.0)
Microalb, Ur: 1.1 mg/dL (ref 0.0–1.9)

## 2022-07-08 LAB — TSH: TSH: 6.5 u[IU]/mL — ABNORMAL HIGH (ref 0.35–5.50)

## 2022-07-08 LAB — HEMOGLOBIN A1C: Hgb A1c MFr Bld: 6.6 % — ABNORMAL HIGH (ref 4.6–6.5)

## 2022-07-08 MED ORDER — LISINOPRIL 20 MG PO TABS
ORAL_TABLET | Freq: Every day | ORAL | 3 refills | Status: DC
  Filled 2022-07-08: qty 90, fill #0
  Filled 2022-09-04: qty 90, 90d supply, fill #0
  Filled 2023-01-02: qty 90, 90d supply, fill #1
  Filled 2023-04-13: qty 90, 90d supply, fill #2
  Filled 2023-06-26: qty 90, 90d supply, fill #3

## 2022-07-08 MED ORDER — OZEMPIC (0.25 OR 0.5 MG/DOSE) 2 MG/3ML ~~LOC~~ SOPN
0.5000 mg | PEN_INJECTOR | SUBCUTANEOUS | 11 refills | Status: DC
  Filled 2022-07-08: qty 3, fill #0
  Filled 2022-09-04 – 2022-11-23 (×5): qty 3, 28d supply, fill #0
  Filled 2023-02-02 – 2023-06-26 (×2): qty 3, 28d supply, fill #1

## 2022-07-08 MED ORDER — LEVOTHYROXINE SODIUM 125 MCG PO TABS
ORAL_TABLET | Freq: Every day | ORAL | 3 refills | Status: DC
Start: 2022-07-08 — End: 2022-07-09
  Filled 2022-07-08: qty 90, fill #0

## 2022-07-08 MED ORDER — AMLODIPINE BESYLATE 5 MG PO TABS
5.0000 mg | ORAL_TABLET | Freq: Every day | ORAL | 3 refills | Status: DC
  Filled 2022-07-08: qty 90, 90d supply, fill #0
  Filled 2022-11-12 – 2022-11-23 (×2): qty 90, 90d supply, fill #1
  Filled 2023-05-27: qty 90, 90d supply, fill #2
  Filled 2023-06-26: qty 90, 90d supply, fill #3

## 2022-07-08 MED ORDER — LOVASTATIN 40 MG PO TABS
40.0000 mg | ORAL_TABLET | Freq: Every day | ORAL | 3 refills | Status: DC
  Filled 2022-07-08: qty 90, 90d supply, fill #0
  Filled 2022-11-12 – 2022-11-23 (×2): qty 90, 90d supply, fill #1
  Filled 2023-06-26: qty 90, 90d supply, fill #2

## 2022-07-08 NOTE — Assessment & Plan Note (Signed)
Update TSH on levothyroxine 125mcg daily.  

## 2022-07-08 NOTE — Assessment & Plan Note (Signed)
Chronic, continues lovastatin '40mg'$  daily. Update FLP. The 10-year ASCVD risk score (Arnett DK, et al., 2019) is: 37.8%   Values used to calculate the score:     Age: 56 years     Sex: Male     Is Non-Hispanic African American: Yes     Diabetic: Yes     Tobacco smoker: No     Systolic Blood Pressure: 432 mmHg     Is BP treated: Yes     HDL Cholesterol: 30.3 mg/dL     Total Cholesterol: 173 mg/dL

## 2022-07-08 NOTE — Assessment & Plan Note (Signed)
Continue xyzal, recommend flonase use

## 2022-07-08 NOTE — Assessment & Plan Note (Signed)
Chronic, deteriorated control despite regular lisinopril use. Will start amlodipine '5mg'$  in addition, watch for ankle edema. RTC 2-3 mo HTN f/u visit.

## 2022-07-08 NOTE — Assessment & Plan Note (Signed)
Ongoing for the past several weeks. Has been treated first with 7d augmentin course then zpack without resolution. He does have ENT f/u planned for tomorrow - will await their evaluation.

## 2022-07-08 NOTE — Progress Notes (Signed)
Patient ID: Jonathan Thomas, male    DOB: 09-Jan-1966, 56 y.o.   MRN: [id number]  This visit was conducted in person.  BP (!) 166/92 (BP Location: Right Arm, Cuff Size: Large)   Pulse 64   Temp 97.6 F (36.4 C) (Temporal)   Ht 5' 7.5" (1.715 m)   Wt 178 lb 2 oz (80.8 kg)   SpO2 98%   BMI 27.49 kg/m   BP Readings from Last 3 Encounters:  07/08/22 (!) 166/92  04/06/22 (!) 156/80  03/26/22 132/90   CC: CPE Subjective:   HPI: Jonathan Thomas is a 56 y.o. male presenting on 07/08/2022 for Annual Exam (C/o sinus drainage, HA, sinus pressure and watery eyes.  Tried Xyzal and Coricidin, helpful. )   DM - continues ozempic 0.'5mg'$  weekly. He stopped ozempic due to hypoglycemic symptoms, then sugar last week increased to 200s so he restarted ozempic. Uses OneTouch meter.  Lab Results  Component Value Date   HGBA1C 6.0 (A) 06/04/2021     HTN - only on lisinopril '20mg'$  daily. Home readings also elevated 150-170/100s.   Recent E-visit for sinusitis treated with 7d augmentin course. No improvement with this. Notes ongoing sinus drainage, headache, sinus pressure and watery eyes. He's been treating with corcedin and xyzal with benefit. He was seen at urgent care this past Friday - treated with zpack and some kind of shot. Planning to see Dr Sharol Roussel ENT tomorrow.   Preventative: Colonoscopy 11/2015 - [facility], rpt 10 yrs [facility])  Prostate cancer screening - yearly  Lung cancer screening - not eligible  Flu shot - declined  [place] 04/2020, 05/2020, no booster Tetanus shot - thinks within 10 yrs - declines  Pneumococcal vaccine - declines, to consider. Shingrix - discussed, declines Seat belt use discussed Sunscreen use discussed, no changing moles on skin  Occ [facility]  Ex smoker quit remotely Alcohol - social  Dentist - due Eye exam yearly   Lives with wife, daughter Occ: [employer]: HS Activity: gym 3d/wk, enjoys 4 wheel riding, walking 5d/wk  Diet: good water, vegetables  some     Relevant past medical, surgical, family and social history reviewed and updated as indicated. Interim medical history since our last visit reviewed. Allergies and medications reviewed and updated. Outpatient Medications Prior to Visit  Medication Sig Dispense Refill   OneTouch Delica Lancets 10F MISC Use as instructed to check blood sugar once daily 100 each 1   levothyroxine (SYNTHROID) 125 MCG tablet TAKE 1 TABLET BY MOUTH DAILY BEFORE BREAKFAST 90 tablet 3   lisinopril (ZESTRIL) 20 MG tablet TAKE 1 TABLET BY MOUTH DAILY. 90 tablet 3   lovastatin (MEVACOR) 40 MG tablet Take 1 tablet (40 mg total) by mouth at bedtime. 90 tablet 3   Semaglutide,0.25 or 0.'5MG'$ /DOS, (OZEMPIC, 0.25 OR 0.5 MG/DOSE,) 2 MG/3ML SOPN Inject 0.25 mg intot he skin once a week for 14 days, then 0.5 mg once a week 3 mL 8   Omega-3 Fatty Acids (FISH OIL) 1000 MG CAPS Take 1 capsule (1,000 mg total) by mouth daily.  0   No facility-administered medications prior to visit.     Per HPI unless specifically indicated in ROS section below Review of Systems  Constitutional:  Negative for activity change, appetite change, chills, fatigue, fever and unexpected weight change.  HENT:  Positive for congestion, postnasal drip, sinus pressure and sore throat. Negative for hearing loss.   Eyes:  Negative for visual disturbance.  Respiratory:  Positive for  cough. Negative for chest tightness, shortness of breath and wheezing.   Cardiovascular:  Negative for chest pain, palpitations and leg swelling.  Gastrointestinal:  Negative for abdominal distention, abdominal pain, blood in stool, constipation, diarrhea, nausea and vomiting.  Genitourinary:  Negative for difficulty urinating and hematuria.  Musculoskeletal:  Negative for arthralgias, myalgias and neck pain.  Skin:  Negative for rash.  Neurological:  Positive for dizziness and headaches. Negative for seizures and syncope.  Hematological:  Negative for adenopathy. Does not  bruise/bleed easily.  Psychiatric/Behavioral:  Negative for dysphoric mood. The patient is not nervous/anxious.     Objective:  BP (!) 166/92 (BP Location: Right Arm, Cuff Size: Large)   Pulse 64   Temp 97.6 F (36.4 C) (Temporal)   Ht 5' 7.5" (1.715 m)   Wt 178 lb 2 oz (80.8 kg)   SpO2 98%   BMI 27.49 kg/m   Wt Readings from Last 3 Encounters:  07/08/22 178 lb 2 oz (80.8 kg)  04/06/22 180 lb (81.6 kg)  03/26/22 181 lb (82.1 kg)      Physical Exam Vitals and nursing note reviewed.  Constitutional:      General: He is not in acute distress.    Appearance: Normal appearance. He is well-developed. He is not ill-appearing.  HENT:     Head: Normocephalic and atraumatic.     Right Ear: Hearing, tympanic membrane, ear canal and external ear normal.     Left Ear: Hearing, tympanic membrane, ear canal and external ear normal.     Mouth/Throat:     Mouth: Mucous membranes are moist.     Pharynx: Oropharynx is clear. No oropharyngeal exudate or posterior oropharyngeal erythema.  Eyes:     General: No scleral icterus.    Extraocular Movements: Extraocular movements intact.     Conjunctiva/sclera: Conjunctivae normal.     Pupils: Pupils are equal, round, and reactive to light.  Neck:     Thyroid: No thyroid mass or thyromegaly.  Cardiovascular:     Rate and Rhythm: Normal rate and regular rhythm.     Pulses: Normal pulses.          Radial pulses are 2+ on the right side and 2+ on the left side.     Heart sounds: Normal heart sounds. No murmur heard. Pulmonary:     Effort: Pulmonary effort is normal. No respiratory distress.     Breath sounds: Normal breath sounds. No wheezing, rhonchi or rales.  Abdominal:     General: Bowel sounds are normal. There is no distension.     Palpations: Abdomen is soft. There is no mass.     Tenderness: There is no abdominal tenderness. There is no guarding or rebound.     Hernia: No hernia is present.  Musculoskeletal:        General: Normal  range of motion.     Cervical back: Normal range of motion and neck supple.     Right lower leg: No edema.     Left lower leg: No edema.     Comments: See HPI for foot exam if done  Lymphadenopathy:     Cervical: No cervical adenopathy.  Skin:    General: Skin is warm and dry.     Findings: No rash.  Neurological:     General: No focal deficit present.     Mental Status: He is alert and oriented to person, place, and time.  Psychiatric:        Mood and Affect: Mood  normal.        Behavior: Behavior normal.        Thought Content: Thought content normal.        Judgment: Judgment normal.       Results for orders placed or performed in visit on 04/24/22  HM DIABETES EYE EXAM  Result Value Ref Range   HM Diabetic Eye Exam No Retinopathy No Retinopathy    Assessment & Plan:   Problem List Items Addressed This Visit     Health maintenance examination - Primary (Chronic)    Preventative protocols reviewed and updated unless pt declined. Discussed healthy diet and lifestyle.       Controlled diabetes mellitus type 2 with complications ([place])    Update control only on ozempic - he stopped for a period due to low sugars, but has since restarted (about 1-2 wks ago).       Relevant Medications   lisinopril (ZESTRIL) 20 MG tablet   lovastatin (MEVACOR) 40 MG tablet   Semaglutide,0.25 or 0.'5MG'$ /DOS, (OZEMPIC, 0.25 OR 0.5 MG/DOSE,) 2 MG/3ML SOPN   Other Relevant Orders   Hemoglobin A1c   Microalbumin / creatinine urine ratio   Essential hypertension    Chronic, deteriorated control despite regular lisinopril use. Will start amlodipine '5mg'$  in addition, watch for ankle edema. RTC 2-3 mo HTN f/u visit.       Relevant Medications   amLODipine (NORVASC) 5 MG tablet   lisinopril (ZESTRIL) 20 MG tablet   lovastatin (MEVACOR) 40 MG tablet   Hyperlipidemia associated with type 2 diabetes mellitus (HCC)    Chronic, continues lovastatin '40mg'$  daily. Update FLP. The 10-year ASCVD risk  score (Arnett DK, et al., 2019) is: 37.8%   Values used to calculate the score:     Age: 83 years     Sex: Male     Is Non-Hispanic African American: Yes     Diabetic: Yes     Tobacco smoker: No     Systolic Blood Pressure: 161 mmHg     Is BP treated: Yes     HDL Cholesterol: 30.3 mg/dL     Total Cholesterol: 173 mg/dL       Relevant Medications   amLODipine (NORVASC) 5 MG tablet   lisinopril (ZESTRIL) 20 MG tablet   lovastatin (MEVACOR) 40 MG tablet   Semaglutide,0.25 or 0.'5MG'$ /DOS, (OZEMPIC, 0.25 OR 0.5 MG/DOSE,) 2 MG/3ML SOPN   Other Relevant Orders   Lipid panel   Comprehensive metabolic panel   Hypothyroidism    Update TSH on levothyroxine 15mg daily.       Relevant Medications   levothyroxine (SYNTHROID) 125 MCG tablet   Other Relevant Orders   TSH   Seasonal allergic rhinitis due to pollen    Continue xyzal, recommend flonase use      Acute sinusitis    Ongoing for the past several weeks. Has been treated first with 7d augmentin course then zpack without resolution. He does have ENT f/u planned for tomorrow - will await their evaluation.       Other Visit Diagnoses     Special screening for malignant neoplasm of prostate       Relevant Orders   PSA        Meds ordered this encounter  Medications   amLODipine (NORVASC) 5 MG tablet    Sig: Take 1 tablet (5 mg total) by mouth daily.    Dispense:  90 tablet    Refill:  3   levothyroxine (SYNTHROID) 125 MCG tablet  Sig: TAKE 1 TABLET BY MOUTH DAILY BEFORE BREAKFAST    Dispense:  90 tablet    Refill:  3   lisinopril (ZESTRIL) 20 MG tablet    Sig: TAKE 1 TABLET BY MOUTH DAILY.    Dispense:  90 tablet    Refill:  3   lovastatin (MEVACOR) 40 MG tablet    Sig: Take 1 tablet (40 mg total) by mouth at bedtime.    Dispense:  90 tablet    Refill:  3   Semaglutide,0.25 or 0.'5MG'$ /DOS, (OZEMPIC, 0.25 OR 0.5 MG/DOSE,) 2 MG/3ML SOPN    Sig: Inject 0.5 mg as directed once a week.    Dispense:  3 mL    Refill:   11   Orders Placed This Encounter  Procedures   Lipid panel   Comprehensive metabolic panel   TSH   Hemoglobin A1c   PSA   Microalbumin / creatinine urine ratio     Patient instructions: Labs today  Consider shingles shots and pneumonia shot as you're eligible for both.  Take flonase nasal steroid for symptoms, will await ENT evaluation tomorrow.  Continue current medicines.  Return as needed or in 2-3 months for hypertension follow up visit   Follow up plan: Return in about 3 months (around 10/08/2022) for follow up visit.  Ria Bush, MD

## 2022-07-08 NOTE — Patient Instructions (Addendum)
Labs today  Consider shingles shots and pneumonia shot as you're eligible for both.  Take flonase nasal steroid for symptoms, will await ENT evaluation tomorrow.  Continue current medicines.  Return as needed or in 2-3 months for hypertension follow up visit   Health Maintenance, Male Adopting a healthy lifestyle and getting preventive care are important in promoting health and wellness. Ask your health care provider about: The right schedule for you to have regular tests and exams. Things you can do on your own to prevent diseases and keep yourself healthy. What should I know about diet, weight, and exercise? Eat a healthy diet  Eat a diet that includes plenty of vegetables, fruits, low-fat dairy products, and lean protein. Do not eat a lot of foods that are high in solid fats, added sugars, or sodium. Maintain a healthy weight Body mass index (BMI) is a measurement that can be used to identify possible weight problems. It estimates body fat based on height and weight. Your health care provider can help determine your BMI and help you achieve or maintain a healthy weight. Get regular exercise Get regular exercise. This is one of the most important things you can do for your health. Most adults should: Exercise for at least 150 minutes each week. The exercise should increase your heart rate and make you sweat (moderate-intensity exercise). Do strengthening exercises at least twice a week. This is in addition to the moderate-intensity exercise. Spend less time sitting. Even light physical activity can be beneficial. Watch cholesterol and blood lipids Have your blood tested for lipids and cholesterol at 57 years of age, then have this test every 5 years. You may need to have your cholesterol levels checked more often if: Your lipid or cholesterol levels are high. You are older than 57 years of age. You are at high risk for heart disease. What should I know about cancer screening? Many  types of cancers can be detected early and may often be prevented. Depending on your health history and family history, you may need to have cancer screening at various ages. This may include screening for: Colorectal cancer. Prostate cancer. Skin cancer. Lung cancer. What should I know about heart disease, diabetes, and high blood pressure? Blood pressure and heart disease High blood pressure causes heart disease and increases the risk of stroke. This is more likely to develop in people who have high blood pressure readings or are overweight. Talk with your health care provider about your target blood pressure readings. Have your blood pressure checked: Every 3-5 years if you are 60-48 years of age. Every year if you are 60 years old or older. If you are between the ages of 37 and 82 and are a current or former smoker, ask your health care provider if you should have a one-time screening for abdominal aortic aneurysm (AAA). Diabetes Have regular diabetes screenings. This checks your fasting blood sugar level. Have the screening done: Once every three years after age 39 if you are at a normal weight and have a low risk for diabetes. More often and at a younger age if you are overweight or have a high risk for diabetes. What should I know about preventing infection? Hepatitis B If you have a higher risk for hepatitis B, you should be screened for this virus. Talk with your health care provider to find out if you are at risk for hepatitis B infection. Hepatitis C Blood testing is recommended for: Everyone born from [birth year] through 1966. Anyone with  known risk factors for hepatitis C. Sexually transmitted infections (STIs) You should be screened each year for STIs, including gonorrhea and chlamydia, if: You are sexually active and are younger than 57 years of age. You are older than 57 years of age and your health care provider tells you that you are at risk for this type of infection. Your  sexual activity has changed since you were last screened, and you are at increased risk for chlamydia or gonorrhea. Ask your health care provider if you are at risk. Ask your health care provider about whether you are at high risk for HIV. Your health care provider may recommend a prescription medicine to help prevent HIV infection. If you choose to take medicine to prevent HIV, you should first get tested for HIV. You should then be tested every 3 months for as long as you are taking the medicine. Follow these instructions at home: Alcohol use Do not drink alcohol if your health care provider tells you not to drink. If you drink alcohol: Limit how much you have to 0-2 drinks a day. Know how much alcohol is in your drink. In the [place] one drink equals one 12 oz bottle of beer (355 mL), one 5 oz glass of wine (148 mL), or one 1 oz glass of hard liquor (44 mL). Lifestyle Do not use any products that contain nicotine or tobacco. These products include cigarettes, chewing tobacco, and vaping devices, such as e-cigarettes. If you need help quitting, ask your health care provider. Do not use street drugs. Do not share needles. Ask your health care provider for help if you need support or information about quitting drugs. General instructions Schedule regular health, dental, and eye exams. Stay current with your vaccines. Tell your health care provider if: You often feel depressed. You have ever been abused or do not feel safe at home. Summary Adopting a healthy lifestyle and getting preventive care are important in promoting health and wellness. Follow your health care provider's instructions about healthy diet, exercising, and getting tested or screened for diseases. Follow your health care provider's instructions on monitoring your cholesterol and blood pressure. This information is not intended to replace advice given to you by your health care provider. Make sure you discuss any questions you  have with your health care provider. Document Revised: 02/10/2021 Document Reviewed: 02/10/2021 Elsevier Patient Education  [place].

## 2022-07-08 NOTE — Assessment & Plan Note (Signed)
Preventative protocols reviewed and updated unless pt declined. Discussed healthy diet and lifestyle.  

## 2022-07-08 NOTE — Assessment & Plan Note (Signed)
Update control only on ozempic - he stopped for a period due to low sugars, but has since restarted (about 1-2 wks ago).

## 2022-07-09 ENCOUNTER — Other Ambulatory Visit: Payer: Self-pay

## 2022-07-09 ENCOUNTER — Other Ambulatory Visit: Payer: Self-pay | Admitting: Family Medicine

## 2022-07-09 MED ORDER — PREDNISONE 10 MG PO TABS
ORAL_TABLET | ORAL | 0 refills | Status: DC
  Filled 2022-07-09: qty 21, 6d supply, fill #0

## 2022-07-09 MED ORDER — LEVOTHYROXINE SODIUM 137 MCG PO TABS
137.0000 ug | ORAL_TABLET | Freq: Every day | ORAL | 3 refills | Status: DC
  Filled 2022-07-09: qty 90, 90d supply, fill #0
  Filled 2022-11-12 – 2022-11-23 (×2): qty 90, 90d supply, fill #1
  Filled 2023-04-13: qty 90, 90d supply, fill #2
  Filled 2023-06-26: qty 90, 90d supply, fill #3

## 2022-09-04 ENCOUNTER — Other Ambulatory Visit: Payer: Self-pay

## 2022-09-07 ENCOUNTER — Telehealth: Payer: Self-pay

## 2022-09-07 ENCOUNTER — Other Ambulatory Visit: Payer: Self-pay

## 2022-09-07 NOTE — Telephone Encounter (Signed)
Pharmacy Patient Advocate Encounter   Received notification from [place] that prior authorization for Ozempic (0.25 or 0.5 MG/DOSE) '2MG'$ /3ML pen-injectors is required/requested.  PA submitted on 09/07/22 to [facility] via [facility] Status is pending

## 2022-09-07 NOTE — Telephone Encounter (Signed)
Pharmacy Patient Advocate Encounter  Prior Authorization for Ozempic has been approved.    PA# [id number] Effective dates: 09/07/2022 through 09/07/2025

## 2022-09-15 ENCOUNTER — Other Ambulatory Visit: Payer: Self-pay

## 2022-11-05 ENCOUNTER — Other Ambulatory Visit: Payer: Self-pay

## 2022-11-12 ENCOUNTER — Encounter: Payer: Self-pay | Admitting: Family Medicine

## 2022-11-13 ENCOUNTER — Telehealth: Payer: BC Managed Care – PPO | Admitting: Family Medicine

## 2022-11-13 ENCOUNTER — Encounter: Payer: Self-pay | Admitting: Family Medicine

## 2022-11-13 ENCOUNTER — Telehealth: Payer: Self-pay

## 2022-11-13 NOTE — Telephone Encounter (Signed)
Noted. Ok to cancel appt as pt attempted to cancel.

## 2022-11-13 NOTE — Telephone Encounter (Signed)
Pt was scheduled for MyChart visit today at 2:00 with Dr. Darnell Level.   I called to get pt ready but he states his wife was supposed to had called to cancel. Pt states he is feeling better. Says his sxs initially consisted of cough and sinus sxs which started 11/11/22. States he's taken some OTC sinus meds which have helped with sinus sxs so he just has the cough now. Pt has pos home Covid test on 11/11/22 but has had 2 neg tests since. 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 Pt agrees he will call back if he does not continue to improve. Fyi to Dr. Darnell Level.

## 2022-11-15 ENCOUNTER — Ambulatory Visit
Admission: EM | Admit: 2022-11-15 | Discharge: 2022-11-15 | Disposition: A | Discharge status: Home / Self Care | Payer: BC Managed Care – PPO | Attending: Emergency Medicine | Admitting: Emergency Medicine

## 2022-11-15 DIAGNOSIS — R051 Acute cough: Secondary | ICD-10-CM | POA: Diagnosis not present

## 2022-11-15 DIAGNOSIS — B349 Viral infection, unspecified: Secondary | ICD-10-CM | POA: Diagnosis not present

## 2022-11-15 MED ORDER — BENZONATATE 100 MG PO CAPS: 100.0000 mg | ORAL_CAPSULE | Freq: Three times a day (TID) | ORAL | 0 refills | Status: DC | PRN

## 2022-11-15 MED ORDER — PROMETHAZINE-DM 6.25-15 MG/5ML PO SYRP: 5.0000 mL | ORAL_SOLUTION | Freq: Four times a day (QID) | ORAL | 0 refills | Status: DC | PRN

## 2022-11-15 NOTE — ED Triage Notes (Signed)
Patient to Urgent Care with complaints of cough and headache. Pain in chest when coughing, reports mostly a dry cough.  Denies any known fevers.  Symptoms started four days ago. Taking robitussin/ mucinex/ nyquil.

## 2022-11-15 NOTE — Progress Notes (Signed)
Pt cancelled appt

## 2022-11-15 NOTE — Discharge Instructions (Addendum)
Take the [facility] as directed.    Take the promethazine DM as directed.  Do not drive, operate machinery, drink alcohol, or perform dangerous activities while taking this medication as it may cause drowsiness.  Follow up with your primary care provider if your symptoms are not improving.

## 2022-11-15 NOTE — ED Provider Notes (Signed)
Roderic Palau    CSN: [id number] Arrival date & time: 11/15/22  [id number]      History   Chief Complaint Chief Complaint  Patient presents with   Cough   Headache    HPI Jonathan Thomas is a 57 y.o. male.  Accompanied by his wife, patient presents with 5-6 day history of congestion and nonproductive cough.  His cough is keeping him awake.  No fever, chills, rash, sore throat, shortness of breath, chest pain, vomiting, diarrhea or other symptoms.  Treating symptoms with Mucinex, Robitussin, NyQuil.  No OTC medications taken today.  He tested positive for COVID at home on 11/11/2022 but then tested negative at home on 11/12/2022.  He is not sure which test was accurate; he declines COVID test today.  His medical history includes diabetes and hypertension.    The history is provided by the patient, the spouse and medical records.    Past Medical History:  Diagnosis Date   Diabetes mellitus without complication ([place])    in past - no meds since 2012   HLD (hyperlipidemia)    Hypertension    Hypothyroidism    Sciatica of left side     Patient Active Problem List   Diagnosis Date Noted   Acute sinusitis 07/08/2022   Seasonal allergic rhinitis due to pollen 03/26/2022   Tinnitus of right ear 03/26/2022   Left shoulder pain 02/28/2021   Health maintenance examination 04/17/2019   Essential hypertension 10/31/2018   Hyperlipidemia associated with type 2 diabetes mellitus ([place]) 10/31/2018   Hypothyroidism 10/31/2018   Controlled diabetes mellitus type 2 with complications ([place]) 123456    Past Surgical History:  Procedure Laterality Date   COLONOSCOPY WITH PROPOFOL N/A 11/25/2015   Procedure: COLONOSCOPY WITH PROPOFOL;  Surgeon: Lucilla Lame, MD;  Location: [facility];  Service: Endoscopy;  Laterality: N/A;   LIPOMA EXCISION     neck ([place])   POLYPECTOMY  11/25/2015   Procedure: POLYPECTOMY INTESTINAL;  Surgeon: Lucilla Lame, MD;  Location: [facility];   Service: Endoscopy;;  Descending colon polyp Sigmoid colon polyp Both cold snare.   TONSILLECTOMY     as child       Home Medications    Prior to Admission medications   Medication Sig Start Date End Date Taking? Authorizing Provider  benzonatate (TESSALON) 100 MG capsule Take 1 capsule (100 mg total) by mouth 3 (three) times daily as needed for cough. 11/15/22  Yes Sharion Balloon, NP  promethazine-dextromethorphan (PROMETHAZINE-DM) 6.25-15 MG/5ML syrup Take 5 mLs by mouth 4 (four) times daily as needed. 11/15/22  Yes Sharion Balloon, NP  amLODipine (NORVASC) 5 MG tablet Take 1 tablet (5 mg total) by mouth daily. 07/08/22   Ria Bush, MD  levothyroxine (SYNTHROID) 137 MCG tablet Take 1 tablet (137 mcg total) by mouth daily before breakfast. 07/09/22 07/09/23  Ria Bush, MD  lisinopril (ZESTRIL) 20 MG tablet TAKE 1 TABLET BY MOUTH DAILY. 07/08/22 07/08/23  Ria Bush, MD  lovastatin (MEVACOR) 40 MG tablet Take 1 tablet (40 mg total) by mouth at bedtime. 07/08/22 07/08/23  Ria Bush, MD  OneTouch Delica Lancets [id number] MISC Use as instructed to check blood sugar once daily 12/12/20   Ria Bush, MD  predniSONE (DELTASONE) 10 MG tablet TAKE 6 TABLETS BY MOUTH ON DAY ONE, 5 TABLETS ON DAY TWO, 4 TABLETS ON DAY THREE, 3 TABLETS ON DAY FOUR, 2 TABLETS ON DAY FIVE AND 1 TABLET ON DAY SIX. Patient not taking: Reported on  11/15/2022 07/09/22     Semaglutide,0.25 or 0.5MG/DOS, (OZEMPIC, 0.25 OR 0.5 MG/DOSE,) 2 MG/3ML SOPN Inject 0.5 mg as directed once a week. 07/08/22   Ria Bush, MD    Family History Family History  Problem Relation Age of Onset   Lung cancer Father    Thyroid disease Father    Diabetes Mother    Thyroid disease Mother    CAD Paternal Grandfather 79   Stroke Neg Hx     Social History Social History   Tobacco Use   Smoking status: Former    Types: Cigars    Quit date: 10/05/2013    Years since quitting: 9.1   Smokeless tobacco: Never    Tobacco comments:    Quit cigars 2015. Quit cigarettes 25+ yrs ago.  Substance Use Topics   Alcohol use: No    Alcohol/week: 0.0 standard drinks of alcohol   Drug use: No     Allergies   Metformin   Review of Systems Review of Systems  Constitutional:  Negative for chills and fever.  HENT:  Positive for congestion. Negative for ear pain and sore throat.   Respiratory:  Positive for cough. Negative for shortness of breath.   Cardiovascular:  Negative for chest pain and palpitations.  Gastrointestinal:  Negative for diarrhea and vomiting.  Skin:  Negative for color change and rash.  All other systems reviewed and are negative.    Physical Exam Triage Vital Signs ED Triage Vitals  Enc Vitals Group     BP      Pulse      Resp      Temp      Temp src      SpO2      Weight      Height      Head Circumference      Peak Flow      Pain Score      Pain Loc      Pain Edu?      Excl. in [place]?    No data found.  Updated Vital Signs BP 132/79   Pulse 90   Temp 98.7 F (37.1 C)   Resp 18   SpO2 95%   Visual Acuity Right Eye Distance:   Left Eye Distance:   Bilateral Distance:    Right Eye Near:   Left Eye Near:    Bilateral Near:     Physical Exam Vitals and nursing note reviewed.  Constitutional:      General: He is not in acute distress.    Appearance: Normal appearance. He is well-developed. He is not ill-appearing.  HENT:     Right Ear: Tympanic membrane normal.     Left Ear: Tympanic membrane normal.     Nose: Congestion and rhinorrhea present.     Mouth/Throat:     Mouth: Mucous membranes are moist.     Pharynx: Oropharynx is clear.  Cardiovascular:     Rate and Rhythm: Normal rate and regular rhythm.     Heart sounds: Normal heart sounds.  Pulmonary:     Effort: Pulmonary effort is normal. No respiratory distress.     Breath sounds: Normal breath sounds. No wheezing, rhonchi or rales.  Musculoskeletal:     Cervical back: Neck supple.  Skin:     General: Skin is warm and dry.  Neurological:     Mental Status: He is alert.  Psychiatric:        Mood and Affect: Mood normal.  Behavior: Behavior normal.      UC Treatments / Results  Labs (all labs ordered are listed, but only abnormal results are displayed) Labs Reviewed - No data to display  EKG   Radiology No results found.  Procedures Procedures (including critical care time)  Medications Ordered in UC Medications - No data to display  Initial Impression / Assessment and Plan / UC Course  I have reviewed the triage vital signs and the nursing notes.  Pertinent labs & imaging results that were available during my care of the patient were reviewed by me and considered in my medical decision making (see chart for details).    Viral illness, cough.  No respiratory distress, lungs are clear, O2 sat 95% on room air.  Patient declines COVID test.  Treating cough with Tessalon Perles for daytime use and promethazine DM for nighttime use.  Precautions for drowsiness with promethazine discussed.  Education provided on viral illness.  Instructed patient to follow up with his PCP if his symptoms are not improving.  He agrees to plan of care.    Final Clinical Impressions(s) / UC Diagnoses   Final diagnoses:  Viral illness  Acute cough     Discharge Instructions      Take the Tessalon Perles as directed.    Take the promethazine DM as directed.  Do not drive, operate machinery, drink alcohol, or perform dangerous activities while taking this medication as it may cause drowsiness.  Follow up with your primary care provider if your symptoms are not improving.        ED Prescriptions     Medication Sig Dispense Auth. Provider   benzonatate (TESSALON) 100 MG capsule Take 1 capsule (100 mg total) by mouth 3 (three) times daily as needed for cough. 21 capsule Sharion Balloon, NP   promethazine-dextromethorphan (PROMETHAZINE-DM) 6.25-15 MG/5ML syrup Take 5 mLs by  mouth 4 (four) times daily as needed. 118 mL Sharion Balloon, NP      PDMP not reviewed this encounter.   Sharion Balloon, NP 11/15/22 [phone]

## 2022-11-18 ENCOUNTER — Ambulatory Visit: Payer: BC Managed Care – PPO | Admitting: Family Medicine

## 2022-11-20 ENCOUNTER — Other Ambulatory Visit: Payer: Self-pay

## 2022-11-23 ENCOUNTER — Other Ambulatory Visit: Payer: Self-pay

## 2023-02-19 ENCOUNTER — Other Ambulatory Visit: Payer: Self-pay

## 2023-06-10 LAB — HM DIABETES EYE EXAM

## 2023-06-22 ENCOUNTER — Other Ambulatory Visit: Payer: Self-pay | Admitting: Orthopedic Surgery

## 2023-06-22 DIAGNOSIS — G8929 Other chronic pain: Secondary | ICD-10-CM

## 2023-06-24 ENCOUNTER — Other Ambulatory Visit: Payer: Self-pay | Admitting: Family Medicine

## 2023-06-24 ENCOUNTER — Other Ambulatory Visit: Payer: Self-pay

## 2023-06-25 ENCOUNTER — Other Ambulatory Visit: Payer: Self-pay

## 2023-06-26 ENCOUNTER — Other Ambulatory Visit: Payer: Self-pay

## 2023-06-30 ENCOUNTER — Other Ambulatory Visit: Payer: Self-pay

## 2023-07-01 ENCOUNTER — Encounter: Payer: Self-pay | Admitting: Orthopedic Surgery

## 2023-07-02 ENCOUNTER — Other Ambulatory Visit: Payer: Self-pay

## 2023-07-07 ENCOUNTER — Ambulatory Visit
Admission: RE | Admit: 2023-07-07 | Discharge: 2023-07-07 | Disposition: A | Discharge status: Home / Self Care | Payer: BC Managed Care – PPO | Source: Ambulatory Visit | Attending: Orthopedic Surgery | Admitting: Orthopedic Surgery

## 2023-07-07 DIAGNOSIS — G8929 Other chronic pain: Secondary | ICD-10-CM

## 2023-07-12 ENCOUNTER — Other Ambulatory Visit: Payer: Self-pay

## 2023-07-13 ENCOUNTER — Other Ambulatory Visit: Payer: Self-pay | Admitting: Family Medicine

## 2023-07-13 ENCOUNTER — Other Ambulatory Visit: Payer: Self-pay

## 2023-07-14 NOTE — Telephone Encounter (Signed)
LVM for patient to c/b and schedule.  

## 2023-07-14 NOTE — Telephone Encounter (Signed)
Patient needs CPE and lab set up. Let me know when done and will send to provider for review.

## 2023-07-15 ENCOUNTER — Other Ambulatory Visit: Payer: Self-pay

## 2023-07-15 MED ORDER — LEVOTHYROXINE SODIUM 137 MCG PO TABS
137.0000 ug | ORAL_TABLET | Freq: Every day | ORAL | 0 refills | Status: DC
  Filled 2023-07-15: qty 90, 90d supply, fill #0

## 2023-07-15 MED ORDER — LISINOPRIL 20 MG PO TABS
20.0000 mg | ORAL_TABLET | Freq: Every day | ORAL | 0 refills | Status: DC
  Filled 2023-07-15: qty 90, 90d supply, fill #0

## 2023-07-15 MED ORDER — LOVASTATIN 40 MG PO TABS
40.0000 mg | ORAL_TABLET | Freq: Every day | ORAL | 0 refills | Status: DC
  Filled 2023-07-15: qty 90, 90d supply, fill #0

## 2023-07-15 NOTE — Telephone Encounter (Signed)
Lvm for patient tcb and schedule 

## 2023-07-15 NOTE — Telephone Encounter (Signed)
Patient scheduled for CPE 10/12/2023

## 2023-07-19 ENCOUNTER — Encounter: Payer: Self-pay | Admitting: Family Medicine

## 2023-07-19 DIAGNOSIS — E118 Type 2 diabetes mellitus with unspecified complications: Secondary | ICD-10-CM

## 2023-07-20 ENCOUNTER — Other Ambulatory Visit: Payer: Self-pay | Admitting: Family Medicine

## 2023-07-20 ENCOUNTER — Other Ambulatory Visit: Payer: Self-pay

## 2023-07-20 MED ORDER — FREESTYLE TEST VI STRP
1.0000 | ORAL_STRIP | Freq: Every day | 0 refills | Status: AC
Start: 2023-07-20 — End: ?
  Filled 2023-07-20: qty 100, 100d supply, fill #0

## 2023-07-20 NOTE — Telephone Encounter (Signed)
E-scribed [employer] test strips.

## 2023-07-21 ENCOUNTER — Other Ambulatory Visit: Payer: Self-pay

## 2023-07-22 ENCOUNTER — Other Ambulatory Visit: Payer: Self-pay

## 2023-07-22 ENCOUNTER — Other Ambulatory Visit: Payer: Self-pay | Admitting: Family Medicine

## 2023-07-23 ENCOUNTER — Other Ambulatory Visit: Payer: Self-pay

## 2023-07-23 MED FILL — Semaglutide Soln Pen-inj 0.25 or 0.5 MG/DOSE (2 MG/3ML): SUBCUTANEOUS | 28 days supply | Qty: 3 | Fill #0 | Status: AC

## 2023-07-23 NOTE — Telephone Encounter (Signed)
ERx 

## 2023-07-26 ENCOUNTER — Other Ambulatory Visit: Payer: Self-pay

## 2023-08-05 NOTE — H&P (Signed)
ORTHOPAEDIC HISTORY & PHYSICAL Dwaine Pringle, Adelina Mings., MD - 07/23/2023 10:00 AM EDT Formatting of this note is different from the original. Chief Complaint: Chief Complaint Patient presents with Left knee MRI 07/14/23 results  Reason for Visit: The patient is a 57 y.o. male who presents today for reevaluation of his left knee. He reports a long history of intermittent left knee pain. He recalls injuring the knee approximately 10 years ago while playing basketball. He modified his activities and the symptoms resolved. He has had some intermittent discomfort since that time. Several months ago he was "on his knees" working on a mower and has had an increase of left knee pain since that time. He localizes most of the pain along the medial aspect of the knee. He reports no swelling, no locking, and no giving way of the knee. The pain is is worse at the end of the day after working and he also notes some "aching" at night. He has occasionally used Biofreeze for symptomatic relief. The patient appreciated some improvement of the symptoms after using meloxicam.  Medications: Current Outpatient Medications Medication Sig Dispense Refill amLODIPine (NORVASC) 5 MG tablet Take 5 mg by mouth once daily blood glucose diagnostic (FREESTYLE TEST) test strip 1 strip once daily levothyroxine (SYNTHROID) 137 MCG tablet Take 137 mcg by mouth once daily lisinopriL (ZESTRIL) 20 MG tablet Take 20 mg by mouth once daily lovastatin (MEVACOR) 40 MG tablet Take 40 mg by mouth at bedtime meloxicam (MOBIC) 15 MG tablet Take 15 mg by mouth once daily semaglutide (OZEMPIC) 0.25 mg or 0.5 mg (2 mg/3 mL) pen injector Inject 0.25 mg subcutaneously every 14 (fourteen) days levothyroxine (SYNTHROID) 137 MCG tablet 137 mcg once daily lisinopriL (ZESTRIL) 20 MG tablet Take 1 tablet by mouth once daily lovastatin (MEVACOR) 40 MG tablet Take 40 mg by mouth at bedtime  No current facility-administered medications for this  visit.  Allergies: No Known Allergies  Past Medical History: Past Medical History: Diagnosis Date Diabetes mellitus type 2, controlled (CMS/HHS-HCC) Hyperlipidemia Hypertension Hypothyroidism  Past Surgical History: History reviewed. No pertinent surgical history.  Social History: Social History  Socioeconomic History Marital status: Married Spouse name: Tonya Number of children: 5 Years of education: 12 Highest education level: High school graduate Occupational History Occupation: Full-time- Optician, dispensing Tobacco Use Smoking status: Former Types: Cigarettes Smokeless tobacco: Never Advertising account planner Vaping status: Never Used Substance and Sexual Activity Alcohol use: Not Currently Drug use: Never Sexual activity: Yes Partners: Female  Family History: Family History Problem Relation Name Age of Onset Diabetes type II Mother Thyroid disease Mother Thyroid disease Father Lung cancer Father  Review of Systems: A comprehensive 14 point ROS was performed, reviewed, and the pertinent orthopaedic findings are documented in the HPI.  Exam BP (!) 150/86  Ht 171.5 cm (5' 7.5")  Wt 78.8 kg (173 lb 12.8 oz)  BMI 26.82 kg/m  General: Well-developed, well-nourished male seen in no acute distress. Even heel to toe gait. No varus or valgus thrust to the left knee.  HEENT: Atraumatic, normocephalic. Pupils are equal and reactive to light. Extraocular motion is intact. Sclera are clear. Oropharynx is clear with moist mucosa.  Lungs: Clear to auscultation bilaterally.  Cardiovascular: Regular rate and rhythm. Normal S1, S2. No murmur . No appreciable gallops or rubs. Peripheral pulses are palpable. No lower extremity edema. Homan`s test is negative.  Extremities: Good strength, stability, and range of motion of the upper extremities. Good range of motion of the hips and ankles.  Left  Knee: Soft tissue swelling: none Effusion: none Erythema: none Crepitance:  severe Tenderness: medial joint line tenderness Alignment: normal Mediolateral laxity: stable Anterior drawer test:negative Lachman`s test: negative McMurray`s test: equivocal Atrophy: No significant atrophy. Quadriceps tone was good. Range of Motion: greater than 120 degrees  Neurologic: Awake, alert, and oriented. Sensory function is intact to pinprick and light touch. Motor strength is judged to be 5/5. Motor coordination is within normal limits. No apparent clonus. No tremor.  MRI: I reviewed the left knee MRI from [facility] dated 07/07/2023. I concur with the radiologist's interpretation as below:  MRI OF THE LEFT KNEE WITHOUT CONTRAST  TECHNIQUE: Multiplanar, multisequence MR imaging of the knee was performed. No intravenous contrast was administered.  COMPARISON: None Available.  FINDINGS: Despite efforts by the technologist and patient, motion artifact is present on today's exam and could not be eliminated. This reduces exam sensitivity and specificity.  MENISCI  Medial meniscus: Radial tear in the midbody, with suspected flap from the free edge of the posterior horn flipped anteriorly into the medial compartment.  Lateral meniscus: Unremarkable  LIGAMENTS  Cruciates: Unremarkable  Collaterals: Unremarkable  CARTILAGE  Patellofemoral: Chondral thinning and irregularity along with degenerative subcortical cysts along the lateral femoral trochlear groove. Similar chondral thinning and irregularity with a single focus of subcortical marrow edema along the lateral patellar facet inferiorly.  Medial: Unremarkable  Lateral: Unremarkable  Joint: Small knee effusion. Thin medial plica. Focal synovitis below the lateral patellar facet potentially from patellar tendon-lateral femoral condyle friction syndrome.  Popliteal Fossa: Unremarkable  Extensor Mechanism: Unremarkable  Bones: No significant extra-articular osseous  abnormalities identified.  Other: No supplemental non-categorized findings.  IMPRESSION: 1. Radial tear in the midbody of the medial meniscus, with suspected flap from the free edge of the posterior horn flipped anteriorly into the medial compartment. 2. Chondral thinning and irregularity along the lateral femoral trochlear groove and lateral patellar facet inferiorly with underlying non-fragmented osteochondral lesions. 3. Small knee effusion with thin medial plica. 4. Focal synovitis below the lateral patellar facet potentially from patellar tendon-lateral femoral condyle friction syndrome.  Electronically Signed By: Gaylyn Rong M.D. On: 07/14/2023 15:17  Impression: Internal derangement of the left knee  Plan: The findings were discussed in detail with the patient. The patient was given informational material on knee arthroscopy. Conservative treatment options were reviewed with the patient. We discussed the risks and benefits of surgical intervention. The usual perioperative course was also discussed in detail. The patient expressed understanding of the risks and benefits of surgical intervention and would like to proceed with plans for left knee arthroscopy.  I spent a total of 40 minutes in both face-to-face and non-face-to-face activities, excluding procedures performed, for this visit on the date of this encounter.  MEDICAL CLEARANCE: Per anesthesiology. ACTIVITIES: Avoid pivoting, squatting, or twisting. WORK STATUS: Anticipate out of work for 2-3 weeks. THERAPY: Quadriceps strengthening exercises. MEDICATIONS: Requested Prescriptions  No prescriptions requested or ordered in this encounter  FOLLOW-UP: Return for postoperative follow-up.  Fedra Lanter P. Angie Fava., M.D.  This note was generated in part with voice recognition software and I apologize for any typographical errors that were not detected and corrected. Electronically signed by Shari Heritage., MD at 07/25/2023 9:19 AM EDT

## 2023-08-05 NOTE — Discharge Instructions (Signed)
  Instructions after Knee Arthroscopy    Jhoan Schmieder P. Angie Fava., M.D.    Dept. of Orthopaedics & Sports Medicine [facility] [address] [place], [place]  [address]  Phone: [phone]   Fax: [phone]       [email or web address]       DIET: Drink plenty of non-alcoholic fluids & begin a light diet. Resume your normal diet the day after surgery.  ACTIVITY:  You may use crutches or a walker with weight-bearing as tolerated, unless instructed otherwise. You may wean yourself off of the walker or crutches as tolerated.  Begin doing gentle exercises. Exercising will reduce the pain and swelling, increase motion, and prevent muscle weakness.   Avoid strenuous activities or athletics for a minimum of 4-6 weeks after arthroscopic surgery. Do not drive or operate any equipment until instructed.  WOUND CARE:  Place one to two pillows under the knee the first day or two when sitting or lying.  Continue to use the ice packs periodically to reduce pain and swelling. The small incisions in your knee are closed with nylon stitches. The stitches will be removed in the office. The bulky dressing may be removed on the second day after surgery. DO NOT TOUCH THE STITCHES. Put a Band-Aid over each stitch. Do NOT use any ointments or creams on the incisions.  You may bathe or shower after the stitches are removed at the first office visit following surgery.  MEDICATIONS: You may resume your regular medications. Please take the pain medication as prescribed. Do not take pain medication on an empty stomach. Do not drive or drink alcoholic beverages when taking pain medications.  CALL THE OFFICE FOR: Temperature above 101 degrees Excessive bleeding or drainage on the dressing. Excessive swelling, coldness, or paleness of the toes. Persistent nausea and vomiting.  FOLLOW-UP:  You should have an appointment to return to the office in 7-10 days after surgery.      [facility] Department Directory         [email or web address]       [email or web address]          Cardiology  Appointments: [place] - [phone]  Endocrinology  Appointments: [place] [phone] [place] - [phone]  Gastroenterology  Appointments: [place] [phone] [place] - [phone]        General Surgery   Appointments: [facility]  Internal Medicine/Family Medicine  Appointments: [facility] [place] - [phone] [place] - [phone]  Metabolic and Weigh Loss Surgery  Appointments: [facility]        Neurology  Appointments: [place] [phone] [place] - [phone]  Neurosurgery  Appointments: [place]  Obstetrics & Gynecology  Appointments: [place] [phone] [place] - [phone]        Pediatrics  Appointments: [place] [phone] [place] - [phone]  Physiatry  Appointments: [place] [phone]  Physical Therapy  Appointments: [place] - [phone]        Podiatry  Appointments: [place] [phone] [place] - [phone]  Pulmonology  Appointments: [place]  Rheumatology  Appointments: [place] [phone]        [place] Location: [facility]  [address] [place], [place]  [address]  [place] Location: [facility] [address] [place], [place]  [address]  [place] Location: [facility] [address] [place], [place]  [address]

## 2023-08-06 ENCOUNTER — Encounter
Admission: RE | Admit: 2023-08-06 | Discharge: 2023-08-06 | Disposition: A | Discharge status: Home / Self Care | Payer: BC Managed Care – PPO | Source: Ambulatory Visit | Attending: Orthopedic Surgery | Admitting: Orthopedic Surgery

## 2023-08-06 VITALS — Ht 67.5 in | Wt 173.7 lb

## 2023-08-06 DIAGNOSIS — E118 Type 2 diabetes mellitus with unspecified complications: Secondary | ICD-10-CM

## 2023-08-06 DIAGNOSIS — Z01818 Encounter for other preprocedural examination: Secondary | ICD-10-CM | POA: Insufficient documentation

## 2023-08-06 DIAGNOSIS — M94262 Chondromalacia, left knee: Secondary | ICD-10-CM | POA: Diagnosis not present

## 2023-08-06 DIAGNOSIS — I10 Essential (primary) hypertension: Secondary | ICD-10-CM

## 2023-08-06 DIAGNOSIS — M2392 Unspecified internal derangement of left knee: Secondary | ICD-10-CM | POA: Diagnosis present

## 2023-08-06 DIAGNOSIS — Z01810 Encounter for preprocedural cardiovascular examination: Secondary | ICD-10-CM

## 2023-08-06 DIAGNOSIS — Z87891 Personal history of nicotine dependence: Secondary | ICD-10-CM | POA: Diagnosis not present

## 2023-08-06 DIAGNOSIS — Z01812 Encounter for preprocedural laboratory examination: Secondary | ICD-10-CM

## 2023-08-06 DIAGNOSIS — S83242A Other tear of medial meniscus, current injury, left knee, initial encounter: Secondary | ICD-10-CM | POA: Diagnosis not present

## 2023-08-06 DIAGNOSIS — Z7985 Long-term (current) use of injectable non-insulin antidiabetic drugs: Secondary | ICD-10-CM | POA: Diagnosis not present

## 2023-08-06 DIAGNOSIS — X58XXXA Exposure to other specified factors, initial encounter: Secondary | ICD-10-CM | POA: Diagnosis not present

## 2023-08-06 DIAGNOSIS — E039 Hypothyroidism, unspecified: Secondary | ICD-10-CM | POA: Diagnosis not present

## 2023-08-06 DIAGNOSIS — E119 Type 2 diabetes mellitus without complications: Secondary | ICD-10-CM | POA: Diagnosis not present

## 2023-08-06 HISTORY — DX: Unspecified internal derangement of left knee: M23.92

## 2023-08-06 HISTORY — DX: Gastro-esophageal reflux disease without esophagitis: K21.9

## 2023-08-06 LAB — CBC
HCT: 40.9 % (ref 39.0–52.0)
Hemoglobin: 14.3 g/dL (ref 13.0–17.0)
MCH: 33.4 pg (ref 26.0–34.0)
MCHC: 35 g/dL (ref 30.0–36.0)
MCV: 95.6 fL (ref 80.0–100.0)
Platelets: 304 10*3/uL (ref 150–400)
RBC: 4.28 MIL/uL (ref 4.22–5.81)
RDW: 12 % (ref 11.5–15.5)
WBC: 10.3 10*3/uL (ref 4.0–10.5)
nRBC: 0 % (ref 0.0–0.2)

## 2023-08-06 NOTE — Patient Instructions (Signed)
Your procedure is scheduled on:08-09-23 Monday Report to the Registration Desk on the 1st floor of the [facility].Then proceed to the 2nd floor Surgery Desk To find out your arrival time, please call [phone] between 1PM - 3PM on:08-06-23 Friday If your arrival time is 6:00 am, do not arrive before that time as the [facility] entrance doors do not open until 6:00 am.  REMEMBER: Instructions that are not followed completely may result in serious medical risk, up to and including death; or upon the discretion of your surgeon and anesthesiologist your surgery may need to be rescheduled.  Do not eat food after midnight the night before surgery.  No gum chewing or hard candies.  You may however, drink Water up to 2 hours before you are scheduled to arrive for your surgery. Do not drink anything within 2 hours of your scheduled arrival time.  In addition, your doctor has ordered for you to drink the provided:  Gatorade G2 Drinking this carbohydrate drink up to two hours before surgery helps to reduce insulin resistance and improve patient outcomes. Please complete drinking 2 hours before scheduled arrival time.  One week prior to surgery: Stop Anti-inflammatories (NSAIDS) such as Advil, Aleve, Ibuprofen, Motrin, Naproxen, Naprosyn and Aspirin based products such as Excedrin, Goody's Powder, BC Powder. Stop ANY OVER THE COUNTER supplements until after surgery.  You may however, continue to take Tylenol if needed for pain up until the day of surgery.  Do NOT take Ozempic again until AFTER your Surgery  Continue taking all of your other prescription medications up until the day of surgery.  ON THE DAY OF SURGERY ONLY TAKE THESE MEDICATIONS WITH SIPS OF WATER: -levothyroxine (SYNTHROID)   No Alcohol for 24 hours before or after surgery.  No Smoking including e-cigarettes for 24 hours before surgery.  No chewable tobacco products for at least 6 hours before surgery.  No nicotine  patches on the day of surgery.  Do not use any "recreational" drugs for at least a week (preferably 2 weeks) before your surgery.  Please be advised that the combination of cocaine and anesthesia may have negative outcomes, up to and including death. If you test positive for cocaine, your surgery will be cancelled.  On the morning of surgery brush your teeth with toothpaste and water, you may rinse your mouth with mouthwash if you wish. Do not swallow any toothpaste or mouthwash.  Use CHG Soap as directed on instruction sheet.  Do not wear jewelry, make-up, hairpins, clips or nail polish.  For welded (permanent) jewelry: bracelets, anklets, waist bands, etc.  Please have this removed prior to surgery.  If it is not removed, there is a chance that hospital personnel will need to cut it off on the day of surgery.  Do not wear lotions, powders, or perfumes.   Do not shave body hair from the neck down 48 hours before surgery.  Contact lenses, hearing aids and dentures may not be worn into surgery.  Do not bring valuables to the hospital. [facility] is not responsible for any missing/lost belongings or valuables.    Notify your doctor if there is any change in your medical condition (cold, fever, infection).  Wear comfortable clothing (specific to your surgery type) to the hospital.  After surgery, you can help prevent lung complications by doing breathing exercises.  Take deep breaths and cough every 1-2 hours. Your doctor may order a device called an Incentive Spirometer to help you take deep breaths. When coughing  or sneezing, hold a pillow firmly against your incision with both hands. This is called "splinting." Doing this helps protect your incision. It also decreases belly discomfort.  If you are being admitted to the hospital overnight, leave your suitcase in the car. After surgery it may be brought to your room.  In case of increased patient census, it may be necessary for you,  the patient, to continue your postoperative care in the Same Day Surgery department.  If you are being discharged the day of surgery, you will not be allowed to drive home. You will need a responsible individual to drive you home and stay with you for 24 hours after surgery.   If you are taking public transportation, you will need to have a responsible individual with you.  Please call the Pre-admissions Testing Dept. at [phone] if you have any questions about these instructions.  Surgery Visitation Policy:  Patients having surgery or a procedure may have two visitors.  Children under the age of 65 must have an adult with them who is not the patient.     Preparing for Surgery with CHLORHEXIDINE GLUCONATE (CHG) Soap  Chlorhexidine Gluconate (CHG) Soap  o An antiseptic cleaner that kills germs and bonds with the skin to continue killing germs even after washing  o Used for showering the night before surgery and morning of surgery  Before surgery, you can play an important role by reducing the number of germs on your skin.  CHG (Chlorhexidine gluconate) soap is an antiseptic cleanser which kills germs and bonds with the skin to continue killing germs even after washing.  Please do not use if you have an allergy to CHG or antibacterial soaps. If your skin becomes reddened/irritated stop using the CHG.  1. Shower the NIGHT BEFORE SURGERY and the MORNING OF SURGERY with CHG soap.  2. If you choose to wash your hair, wash your hair first as usual with your normal shampoo.  3. After shampooing, rinse your hair and body thoroughly to remove the shampoo.  4. Use CHG as you would any other liquid soap. You can apply CHG directly to the skin and wash gently with a scrungie or a clean washcloth.  5. Apply the CHG soap to your body only from the neck down. Do not use on open wounds or open sores. Avoid contact with your eyes, ears, mouth, and genitals (private parts). Wash face and  genitals (private parts) with your normal soap.  6. Wash thoroughly, paying special attention to the area where your surgery will be performed.  7. Thoroughly rinse your body with warm water.  8. Do not shower/wash with your normal soap after using and rinsing off the CHG soap.  9. Pat yourself dry with a clean towel.  10. Wear clean pajamas to bed the night before surgery.  12. Place clean sheets on your bed the night of your first shower and do not sleep with pets.  13. Shower again with the CHG soap on the day of surgery prior to arriving at the hospital.  14. Do not apply any deodorants/lotions/powders.  15. Please wear clean clothes to the hospital.  How to Use an Incentive Spirometer An incentive spirometer is a tool that measures how well you are filling your lungs with each breath. Learning to take long, deep breaths using this tool can help you keep your lungs clear and active. This may help to reverse or lessen your chance of developing breathing (pulmonary) problems, especially infection. You  may be asked to use a spirometer: After a surgery. If you have a lung problem or a history of smoking. After a long period of time when you have been unable to move or be active. If the spirometer includes an indicator to show the highest number that you have reached, your health care provider or respiratory therapist will help you set a goal. Keep a log of your progress as told by your health care provider. What are the risks? Breathing too quickly may cause dizziness or cause you to pass out. Take your time so you do not get dizzy or light-headed. If you are in pain, you may need to take pain medicine before doing incentive spirometry. It is harder to take a deep breath if you are having pain. How to use your incentive spirometer  Sit up on the edge of your bed or on a chair. Hold the incentive spirometer so that it is in an upright position. Before you use the spirometer, breathe  out normally. Place the mouthpiece in your mouth. Make sure your lips are closed tightly around it. Breathe in slowly and as deeply as you can through your mouth, causing the piston or the ball to rise toward the top of the chamber. Hold your breath for 3-5 seconds, or for as long as possible. If the spirometer includes a coach indicator, use this to guide you in breathing. Slow down your breathing if the indicator goes above the marked areas. Remove the mouthpiece from your mouth and breathe out normally. The piston or ball will return to the bottom of the chamber. Rest for a few seconds, then repeat the steps 10 or more times. Take your time and take a few normal breaths between deep breaths so that you do not get dizzy or light-headed. Do this every 1-2 hours when you are awake. If the spirometer includes a goal marker to show the highest number you have reached (best effort), use this as a goal to work toward during each repetition. After each set of 10 deep breaths, cough a few times. This will help to make sure that your lungs are clear. If you have an incision on your chest or abdomen from surgery, place a pillow or a rolled-up towel firmly against the incision when you cough. This can help to reduce pain while taking deep breaths and coughing. General tips When you are able to get out of bed: Walk around often. Continue to take deep breaths and cough in order to clear your lungs. Keep using the incentive spirometer until your health care provider says it is okay to stop using it. If you have been in the hospital, you may be told to keep using the spirometer at home. Contact a health care provider if: You are having difficulty using the spirometer. You have trouble using the spirometer as often as instructed. Your pain medicine is not giving enough relief for you to use the spirometer as told. You have a fever. Get help right away if: You develop shortness of breath. You develop a  cough with bloody mucus from the lungs. You have fluid or blood coming from an incision site after you cough. Summary An incentive spirometer is a tool that can help you learn to take long, deep breaths to keep your lungs clear and active. You may be asked to use a spirometer after a surgery, if you have a lung problem or a history of smoking, or if you have been inactive for a  long period of time. Use your incentive spirometer as instructed every 1-2 hours while you are awake. If you have an incision on your chest or abdomen, place a pillow or a rolled-up towel firmly against your incision when you cough. This will help to reduce pain. Get help right away if you have shortness of breath, you cough up bloody mucus, or blood comes from your incision when you cough. This information is not intended to replace advice given to you by your health care provider. Make sure you discuss any questions you have with your health care provider. Document Revised: 12/11/2019 Document Reviewed: 12/11/2019 Elsevier Patient Education  2024 [employer].

## 2023-08-08 ENCOUNTER — Encounter: Payer: Self-pay | Admitting: Orthopedic Surgery

## 2023-08-09 ENCOUNTER — Encounter: Payer: Self-pay | Admitting: Orthopedic Surgery

## 2023-08-09 ENCOUNTER — Other Ambulatory Visit: Payer: Self-pay

## 2023-08-09 ENCOUNTER — Ambulatory Visit: Payer: Self-pay | Admitting: Urgent Care

## 2023-08-09 ENCOUNTER — Ambulatory Visit: Payer: BC Managed Care – PPO | Admitting: General Practice

## 2023-08-09 ENCOUNTER — Ambulatory Visit
Admission: RE | Admit: 2023-08-09 | Discharge: 2023-08-09 | Disposition: A | Discharge status: Home / Self Care | Payer: BC Managed Care – PPO | Attending: Orthopedic Surgery | Admitting: Orthopedic Surgery

## 2023-08-09 ENCOUNTER — Encounter
Admission: RE | Disposition: A | Discharge status: Home / Self Care | Payer: Self-pay | Source: Home / Self Care | Attending: Orthopedic Surgery

## 2023-08-09 DIAGNOSIS — M94262 Chondromalacia, left knee: Secondary | ICD-10-CM | POA: Insufficient documentation

## 2023-08-09 DIAGNOSIS — S83242A Other tear of medial meniscus, current injury, left knee, initial encounter: Secondary | ICD-10-CM | POA: Diagnosis not present

## 2023-08-09 DIAGNOSIS — Z7985 Long-term (current) use of injectable non-insulin antidiabetic drugs: Secondary | ICD-10-CM | POA: Insufficient documentation

## 2023-08-09 DIAGNOSIS — Z87891 Personal history of nicotine dependence: Secondary | ICD-10-CM | POA: Insufficient documentation

## 2023-08-09 DIAGNOSIS — E039 Hypothyroidism, unspecified: Secondary | ICD-10-CM | POA: Insufficient documentation

## 2023-08-09 DIAGNOSIS — X58XXXA Exposure to other specified factors, initial encounter: Secondary | ICD-10-CM | POA: Insufficient documentation

## 2023-08-09 DIAGNOSIS — E119 Type 2 diabetes mellitus without complications: Secondary | ICD-10-CM | POA: Insufficient documentation

## 2023-08-09 DIAGNOSIS — Z9889 Other specified postprocedural states: Secondary | ICD-10-CM

## 2023-08-09 DIAGNOSIS — Z01818 Encounter for other preprocedural examination: Secondary | ICD-10-CM

## 2023-08-09 DIAGNOSIS — I10 Essential (primary) hypertension: Secondary | ICD-10-CM | POA: Insufficient documentation

## 2023-08-09 HISTORY — PX: KNEE ARTHROSCOPY: SHX127

## 2023-08-09 LAB — GLUCOSE, CAPILLARY
Glucose-Capillary: 86 mg/dL (ref 70–99)
Glucose-Capillary: 136 mg/dL — ABNORMAL HIGH (ref 70–99)

## 2023-08-09 SURGERY — ARTHROSCOPY, KNEE
Anesthesia: General | Site: Knee | Laterality: Left

## 2023-08-09 MED ORDER — MORPHINE SULFATE 4 MG/ML IJ SOLN
INTRAMUSCULAR | Status: DC | PRN
  Administered 2023-08-09: 29 mL

## 2023-08-09 MED ORDER — SODIUM CHLORIDE 0.9 % IV SOLN: INTRAVENOUS | Status: DC

## 2023-08-09 MED ORDER — CHLORHEXIDINE GLUCONATE 0.12 % MT SOLN
OROMUCOSAL | Status: AC
  Filled 2023-08-09: qty 15

## 2023-08-09 MED ORDER — HYDROCODONE-ACETAMINOPHEN 5-325 MG PO TABS: 1.0000 | ORAL_TABLET | ORAL | 0 refills | Status: DC | PRN

## 2023-08-09 MED ORDER — ACETAMINOPHEN 10 MG/ML IV SOLN
INTRAVENOUS | Status: DC | PRN
  Administered 2023-08-09: 1000 mg via INTRAVENOUS

## 2023-08-09 MED ORDER — ALBUTEROL SULFATE HFA 108 (90 BASE) MCG/ACT IN AERS
INHALATION_SPRAY | RESPIRATORY_TRACT | Status: DC | PRN
  Administered 2023-08-09: 6 via RESPIRATORY_TRACT

## 2023-08-09 MED ORDER — BUPIVACAINE-EPINEPHRINE (PF) 0.25% -1:200000 IJ SOLN
INTRAMUSCULAR | Status: AC
  Filled 2023-08-09: qty 30

## 2023-08-09 MED ORDER — FENTANYL CITRATE (PF) 100 MCG/2ML IJ SOLN
INTRAMUSCULAR | Status: AC
  Filled 2023-08-09: qty 2

## 2023-08-09 MED ORDER — MIDAZOLAM HCL 2 MG/2ML IJ SOLN
INTRAMUSCULAR | Status: AC
  Filled 2023-08-09: qty 2

## 2023-08-09 MED ORDER — LIDOCAINE HCL (CARDIAC) PF 100 MG/5ML IV SOSY
PREFILLED_SYRINGE | INTRAVENOUS | Status: DC | PRN
  Administered 2023-08-09: 100 mg via INTRAVENOUS

## 2023-08-09 MED ORDER — DEXAMETHASONE SODIUM PHOSPHATE 10 MG/ML IJ SOLN
INTRAMUSCULAR | Status: DC | PRN
  Administered 2023-08-09: 10 mg via INTRAVENOUS

## 2023-08-09 MED ORDER — MORPHINE SULFATE (PF) 4 MG/ML IV SOLN
INTRAVENOUS | Status: AC
  Filled 2023-08-09: qty 1

## 2023-08-09 MED ORDER — BUPIVACAINE-EPINEPHRINE 0.25% -1:200000 IJ SOLN
INTRAMUSCULAR | Status: DC | PRN
  Administered 2023-08-09: 5 mL

## 2023-08-09 MED ORDER — PROPOFOL 10 MG/ML IV BOLUS
INTRAVENOUS | Status: AC
  Filled 2023-08-09: qty 20

## 2023-08-09 MED ORDER — FENTANYL CITRATE (PF) 100 MCG/2ML IJ SOLN
INTRAMUSCULAR | Status: DC | PRN
  Administered 2023-08-09 (×4): 50 ug via INTRAVENOUS

## 2023-08-09 MED ORDER — PROPOFOL 10 MG/ML IV BOLUS
INTRAVENOUS | Status: DC | PRN
  Administered 2023-08-09: 200 mg via INTRAVENOUS
  Administered 2023-08-09: 30 mg via INTRAVENOUS

## 2023-08-09 MED ORDER — CELECOXIB 200 MG PO CAPS
400.0000 mg | ORAL_CAPSULE | Freq: Once | ORAL | Status: AC
  Administered 2023-08-09: 400 mg via ORAL

## 2023-08-09 MED ORDER — CHLORHEXIDINE GLUCONATE 0.12 % MT SOLN
15.0000 mL | Freq: Once | OROMUCOSAL | Status: AC
  Administered 2023-08-09: 15 mL via OROMUCOSAL

## 2023-08-09 MED ORDER — LACTATED RINGERS IR SOLN
Status: DC | PRN
  Administered 2023-08-09 (×2): 3000 mL

## 2023-08-09 MED ORDER — HYDROCODONE-ACETAMINOPHEN 5-325 MG PO TABS
1.0000 | ORAL_TABLET | ORAL | 0 refills | Status: DC | PRN
  Filled 2023-08-09: qty 10, 2d supply, fill #0

## 2023-08-09 MED ORDER — CELECOXIB 200 MG PO CAPS
ORAL_CAPSULE | ORAL | Status: AC
  Filled 2023-08-09: qty 2

## 2023-08-09 MED ORDER — ORAL CARE MOUTH RINSE: 15.0000 mL | Freq: Once | OROMUCOSAL | Status: AC

## 2023-08-09 MED ORDER — MIDAZOLAM HCL 2 MG/2ML IJ SOLN
INTRAMUSCULAR | Status: DC | PRN
  Administered 2023-08-09: 2 mg via INTRAVENOUS

## 2023-08-09 MED ORDER — ONDANSETRON HCL 4 MG/2ML IJ SOLN
INTRAMUSCULAR | Status: AC
  Filled 2023-08-09: qty 2

## 2023-08-09 MED ORDER — ACETAMINOPHEN 10 MG/ML IV SOLN
INTRAVENOUS | Status: AC
  Filled 2023-08-09: qty 100

## 2023-08-09 MED ORDER — ONDANSETRON HCL 4 MG/2ML IJ SOLN
INTRAMUSCULAR | Status: DC | PRN
  Administered 2023-08-09: 4 mg via INTRAVENOUS

## 2023-08-09 SURGICAL SUPPLY — 27 items
ADAPTER IRRIG TUBE 2 SPIKE SOL (ADAPTER) ×2 IMPLANT
ADPR TBG 2 SPK PMP STRL ASCP (ADAPTER) ×2
BLADE SHAVER 4.5 DBL SERAT CV (CUTTER) ×1 IMPLANT
CUFF TOURN DUAL QUICK 24 (TOURNIQUET CUFF) IMPLANT
DRAPE ARTHRO LIMB 89X125 STRL (DRAPES) ×1 IMPLANT
DURAPREP 26ML APPLICATOR (WOUND CARE) ×1 IMPLANT
GAUZE SPONGE 4X4 12PLY STRL (GAUZE/BANDAGES/DRESSINGS) ×1 IMPLANT
GAUZE XEROFORM 1X8 LF (GAUZE/BANDAGES/DRESSINGS) ×1 IMPLANT
GLOVE BIOGEL M STRL SZ7.5 (GLOVE) ×1 IMPLANT
GLOVE SURG UNDER POLY LF SZ8 (GLOVE) ×2 IMPLANT
GOWN STRL REUS W/ TWL LRG LVL3 (GOWN DISPOSABLE) ×2 IMPLANT
GOWN STRL REUS W/TWL LRG LVL3 (GOWN DISPOSABLE) ×2
IV LR IRRIG 3000ML ARTHROMATIC (IV SOLUTION) ×2 IMPLANT
KIT TURNOVER KIT A (KITS) ×1 IMPLANT
MANIFOLD NEPTUNE II (INSTRUMENTS) ×1 IMPLANT
PACK ARTHROSCOPY KNEE (MISCELLANEOUS) ×1 IMPLANT
PAD ARMBOARD 7.5X6 YLW CONV (MISCELLANEOUS) ×1 IMPLANT
STOCKINETTE BIAS CUT 6 980064 (GAUZE/BANDAGES/DRESSINGS) ×1 IMPLANT
SUT ETHILON 3-0 FS-10 30 BLK (SUTURE) ×1
SUTURE EHLN 3-0 FS-10 30 BLK (SUTURE) ×1 IMPLANT
SYR 5ML 18GX1 1/2 (NEEDLE) IMPLANT
TRAP FLUID SMOKE EVACUATOR (MISCELLANEOUS) ×1 IMPLANT
TUBE SET DOUBLEFLO INFLOW (TUBING) ×1 IMPLANT
TUBE SET DOUBLEFLO OUTFLOW (TUBING) ×1 IMPLANT
WAND HAND CNTRL MULTIVAC 50 (MISCELLANEOUS) ×1 IMPLANT
WATER STERILE IRR 500ML POUR (IV SOLUTION) ×1 IMPLANT
WRAP KNEE W/COLD PACKS 25.5X14 (SOFTGOODS) ×1 IMPLANT

## 2023-08-09 NOTE — Anesthesia Procedure Notes (Signed)
Procedure Name: LMA Insertion Date/Time: 08/09/2023 4:36 PM  Performed by: Maryla Morrow., CRNAPre-anesthesia Checklist: Patient identified, Patient being monitored, Timeout performed, Emergency Drugs available and Suction available Patient Re-evaluated:Patient Re-evaluated prior to induction Oxygen Delivery Method: Circle system utilized Preoxygenation: Pre-oxygenation with 100% oxygen Induction Type: IV induction Ventilation: Mask ventilation without difficulty LMA: LMA inserted LMA Size: 4.0 Tube type: Oral Number of attempts: 1 Placement Confirmation: positive ETCO2 and breath sounds checked- equal and bilateral Tube secured with: Tape Dental Injury: Teeth and Oropharynx as per pre-operative assessment

## 2023-08-09 NOTE — Interval H&P Note (Signed)
History and Physical Interval Note:  08/09/2023 3:48 PM  Jonathan Thomas  has presented today for surgery, with the diagnosis of INTERNAL DERANGEMENT OF LEFT KNEE..  The various methods of treatment have been discussed with the patient and family. After consideration of risks, benefits and other options for treatment, the patient has consented to  Procedure(s): ARTHROSCOPY KNEE (Left) as a surgical intervention.  The patient's history has been reviewed, patient examined, no change in status, stable for surgery.  I have reviewed the patient's chart and labs.  Questions were answered to the patient's satisfaction.     Jonathan Thomas P Sharyn Brilliant

## 2023-08-09 NOTE — Anesthesia Preprocedure Evaluation (Signed)
Anesthesia Evaluation  Patient identified by MRN, date of birth, ID band Patient awake    Reviewed: Allergy & Precautions, H&P , NPO status , Patient's Chart, lab work & pertinent test results, reviewed documented beta blocker date and time   Airway Mallampati: II  TM Distance: >3 FB Neck ROM: full    Dental no notable dental hx. (+) Chipped, Dental Advidsory Given   Pulmonary neg pulmonary ROS, former smoker   Pulmonary exam normal breath sounds clear to auscultation       Cardiovascular Exercise Tolerance: Good hypertension, negative cardio ROS Normal cardiovascular exam Rhythm:regular Rate:Normal     Neuro/Psych negative neurological ROS  negative psych ROS   GI/Hepatic Neg liver ROS,GERD  Medicated and Controlled,,  Endo/Other  diabetesHypothyroidism    Renal/GU      Musculoskeletal   Abdominal   Peds  Hematology negative hematology ROS (+)   Anesthesia Other Findings Past Medical History: No date: Diabetes mellitus without complication (HCC)     Comment:  in past - no meds since 2012 No date: GERD (gastroesophageal reflux disease)     Comment:  occ-no meds No date: HLD (hyperlipidemia) No date: Hypertension No date: Hypothyroidism No date: Internal derangement of knee, left No date: Sciatica of left side  Past Surgical History: 11/25/2015: COLONOSCOPY WITH PROPOFOL; N/A     Comment:  Procedure: COLONOSCOPY WITH PROPOFOL;  Surgeon: Midge Minium, MD;  Location: [facility];  Service:               Endoscopy;  Laterality: N/A; No date: LIPOMA EXCISION     Comment:  neck Evette Cristal) 11/25/2015: POLYPECTOMY     Comment:  Procedure: POLYPECTOMY INTESTINAL;  Surgeon: Midge Minium, MD;  Location: [facility];  Service:               Endoscopy;;  Descending colon polyp Sigmoid colon               polyp Both cold snare. No date: TONSILLECTOMY     Comment:  as  child  BMI    Body Mass Index: 26.81 kg/m      Reproductive/Obstetrics negative OB ROS                             Anesthesia Physical Anesthesia Plan  ASA: 2  Anesthesia Plan: General   Post-op Pain Management:    Induction: Intravenous  PONV Risk Score and Plan: Ondansetron, Dexamethasone and Midazolam  Airway Management Planned: LMA  Additional Equipment:   Intra-op Plan:   Post-operative Plan: Extubation in OR  Informed Consent: I have reviewed the patients History and Physical, chart, labs and discussed the procedure including the risks, benefits and alternatives for the proposed anesthesia with the patient or authorized representative who has indicated his/her understanding and acceptance.     Dental Advisory Given  Plan Discussed with: Anesthesiologist, CRNA and Surgeon  Anesthesia Plan Comments: (Patient consented for risks of anesthesia including but not limited to:  - adverse reactions to medications - damage to eyes, teeth, lips or other oral mucosa - nerve damage due to positioning  - sore throat or hoarseness - Damage to heart, brain, nerves, lungs, other parts of body or loss of life  Patient voiced understanding and assent.)  Anesthesia Quick Evaluation

## 2023-08-09 NOTE — Op Note (Signed)
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   ASSISTANT: none  ANESTHESIA: general  ESTIMATED BLOOD LOSS: Minimal  FLUIDS REPLACED: 700 mL of crystalloid  TOURNIQUET TIME: Not used  INDICATIONS FOR SURGERY: Jonathan Thomas is a 57 y.o. year old male who has been seen for complaints of left knee pain. MRI demonstrated findings consistent with meniscal pathology. After discussion of the risks and benefits of surgical intervention, the patient expressed understanding of the risks benefits and agree with plans for left knee arthroscopy.   PROCEDURE IN DETAIL: The patient was brought into the operating room and, after adequate general anesthesia was achieved, a tourniquet was applied to the left thigh and the leg was placed in the leg holder. All bony prominences were well padded. The patient's left knee was cleaned and prepped with alcohol and Duraprep and draped in the usual sterile fashion. A "timeout" was performed as per usual protocol. The anticipated portal sites were injected with 0.25% Marcaine with epinephrine. An anterolateral incision was made and a cannula was inserted. A small effusion was evacuated and the knee was distended with fluid using the pump. The scope was advanced down the medial gutter into the medial compartment. Under visualization with the scope, an anteromedial portal was created and a hooked probe was inserted. The medial meniscus was visualized and probed.  There was a flap type tear of the posterior horn of the medial meniscus.  The tear was  debrided using meniscal punches and the incisor shaver.  Additional contouring was performed using the 50 degree ArthroCare wand.  The remaining rim of meniscus was visualized and probed and felt to be stable.  The articular cartilage was visualized and noted to be in good condition.  The scope was then advanced into the intercondylar notch. The anterior cruciate ligament was visualized and probed and felt to be intact. The scope was removed from the lateral portal and reinserted via the anteromedial portal to better visualize the lateral compartment. The lateral meniscus was visualized and probed.  The lateral meniscus was intact without evidence of tear or instability.  The articular cartilage of the lateral compartment was visualized and noted to be in good condition.  Finally, the scope was advanced so as to visualize the patellofemoral articulation. Good patellar tracking was appreciated.  Grade II-III chondromalacia was noted to the patellofemoral articulation.  These areas were debrided and contoured using the 50 degree ArthroCare wand.  The knee was irrigated with copius amounts of fluid and suctioned dry. The anterolateral portal was re-approximated with #3-0 nylon. A combination of 0.25% Marcaine with epinephrine and 4 mg of Morphine were injected via the scope. The scope was removed and the anteromedial portal was re-approximated with #3-0 nylon. A sterile dressing was applied followed by application of an ice wrap.  The patient tolerated the procedure well and was transported to the PACU in stable condition.  Jonathan Thomas P. Angie Fava., M.D.

## 2023-08-09 NOTE — Transfer of Care (Signed)
Immediate Anesthesia Transfer of Care Note  Patient: Jonathan Thomas  Procedure(s) Performed: ARTHROSCOPY KNEE, PARTIAL MEDIAL MENISCECTOMY, CHONDROPLASTY (Left: Knee)  Patient Location: PACU  Anesthesia Type:General  Level of Consciousness: awake, alert , and oriented  Airway & Oxygen Therapy: Patient Spontanous Breathing and Patient connected to face mask oxygen  Post-op Assessment: Report given to RN and Post -op Vital signs reviewed and stable  Post vital signs: Reviewed and stable  Last Vitals:  Vitals Value Taken Time  BP 149/90 08/09/23 1800  Temp    Pulse 89 08/09/23 1801  Resp 16 08/09/23 1801  SpO2 99 % 08/09/23 1801  Vitals shown include unfiled device data.  Last Pain:  Vitals:   08/09/23 1311  TempSrc: Temporal  PainSc: 0-No pain         Complications: No notable events documented.

## 2023-08-09 NOTE — Anesthesia Postprocedure Evaluation (Signed)
Anesthesia Post Note  Patient: Jonathan Thomas  Procedure(s) Performed: ARTHROSCOPY KNEE, PARTIAL MEDIAL MENISCECTOMY, CHONDROPLASTY (Left: Knee)  Patient location during evaluation: PACU Anesthesia Type: General Level of consciousness: awake and alert, oriented and patient cooperative Pain management: pain level controlled Vital Signs Assessment: post-procedure vital signs reviewed and stable Respiratory status: spontaneous breathing, nonlabored ventilation and respiratory function stable Cardiovascular status: blood pressure returned to baseline and stable Postop Assessment: adequate PO intake Anesthetic complications: no   No notable events documented.   Last Vitals:  Vitals:   08/09/23 1830 08/09/23 1844  BP: (!) 142/87 135/85  Pulse: 79 83  Resp:  16  Temp:  36.7 C  SpO2: 97% 98%    Last Pain:  Vitals:   08/09/23 1844  TempSrc:   PainSc: 0-No pain                 Reed Breech

## 2023-08-10 ENCOUNTER — Encounter: Payer: Self-pay | Admitting: Orthopedic Surgery

## 2023-09-30 ENCOUNTER — Other Ambulatory Visit: Payer: Self-pay | Admitting: Family Medicine

## 2023-09-30 DIAGNOSIS — Z125 Encounter for screening for malignant neoplasm of prostate: Secondary | ICD-10-CM

## 2023-09-30 DIAGNOSIS — E1169 Type 2 diabetes mellitus with other specified complication: Secondary | ICD-10-CM

## 2023-09-30 DIAGNOSIS — E039 Hypothyroidism, unspecified: Secondary | ICD-10-CM

## 2023-10-04 ENCOUNTER — Other Ambulatory Visit (INDEPENDENT_AMBULATORY_CARE_PROVIDER_SITE_OTHER): Payer: BC Managed Care – PPO

## 2023-10-04 DIAGNOSIS — Z125 Encounter for screening for malignant neoplasm of prostate: Secondary | ICD-10-CM | POA: Diagnosis not present

## 2023-10-04 DIAGNOSIS — E1169 Type 2 diabetes mellitus with other specified complication: Secondary | ICD-10-CM

## 2023-10-04 DIAGNOSIS — E785 Hyperlipidemia, unspecified: Secondary | ICD-10-CM | POA: Diagnosis not present

## 2023-10-04 DIAGNOSIS — E039 Hypothyroidism, unspecified: Secondary | ICD-10-CM

## 2023-10-04 LAB — COMPREHENSIVE METABOLIC PANEL
ALT: 21 U/L (ref 0–53)
AST: 16 U/L (ref 0–37)
Albumin: 4.5 g/dL (ref 3.5–5.2)
Alkaline Phosphatase: 54 U/L (ref 39–117)
BUN: 14 mg/dL (ref 6–23)
CO2: 25 meq/L (ref 19–32)
Calcium: 9.6 mg/dL (ref 8.4–10.5)
Chloride: 105 meq/L (ref 96–112)
Creatinine, Ser: 1 mg/dL (ref 0.40–1.50)
GFR: 83.05 mL/min (ref >60.00)
Glucose, Bld: 151 mg/dL — ABNORMAL HIGH (ref 70–99)
Potassium: 4.4 meq/L (ref 3.5–5.1)
Sodium: 138 meq/L (ref 135–145)
Total Bilirubin: 0.4 mg/dL (ref 0.2–1.2)
Total Protein: 7.2 g/dL (ref 6.0–8.3)

## 2023-10-04 LAB — LIPID PANEL
Cholesterol: 189 mg/dL (ref 0–200)
HDL: 28.1 mg/dL — ABNORMAL LOW (ref >39.00)
LDL Cholesterol: 130 mg/dL — ABNORMAL HIGH (ref 0–99)
NonHDL: 161.34
Total CHOL/HDL Ratio: 7
Triglycerides: 157 mg/dL — ABNORMAL HIGH (ref 0.0–149.0)
VLDL: 31.4 mg/dL (ref 0.0–40.0)

## 2023-10-04 LAB — TSH: TSH: 4.19 u[IU]/mL (ref 0.35–5.50)

## 2023-10-04 LAB — HEMOGLOBIN A1C: Hgb A1c MFr Bld: 8 % — ABNORMAL HIGH (ref 4.6–6.5)

## 2023-10-04 LAB — MICROALBUMIN / CREATININE URINE RATIO
Creatinine,U: 100.6 mg/dL
Microalb Creat Ratio: 0.8 mg/g (ref 0.0–30.0)
Microalb, Ur: 0.9 mg/dL (ref 0.0–1.9)

## 2023-10-04 LAB — PSA: PSA: 2.1 ng/mL (ref 0.10–4.00)

## 2023-10-04 LAB — VITAMIN B12: Vitamin B-12: 277 pg/mL (ref 211–911)

## 2023-10-12 ENCOUNTER — Other Ambulatory Visit: Payer: Self-pay

## 2023-10-12 ENCOUNTER — Ambulatory Visit (INDEPENDENT_AMBULATORY_CARE_PROVIDER_SITE_OTHER): Payer: Self-pay | Admitting: Family Medicine

## 2023-10-12 ENCOUNTER — Encounter: Payer: Self-pay | Admitting: Family Medicine

## 2023-10-12 VITALS — BP 134/82 | HR 73 | Temp 98.1°F | Ht 67.5 in | Wt 173.1 lb

## 2023-10-12 DIAGNOSIS — E538 Deficiency of other specified B group vitamins: Secondary | ICD-10-CM | POA: Insufficient documentation

## 2023-10-12 DIAGNOSIS — Z7985 Long-term (current) use of injectable non-insulin antidiabetic drugs: Secondary | ICD-10-CM

## 2023-10-12 DIAGNOSIS — E785 Hyperlipidemia, unspecified: Secondary | ICD-10-CM

## 2023-10-12 DIAGNOSIS — E1169 Type 2 diabetes mellitus with other specified complication: Secondary | ICD-10-CM | POA: Diagnosis not present

## 2023-10-12 DIAGNOSIS — Z0000 Encounter for general adult medical examination without abnormal findings: Secondary | ICD-10-CM | POA: Diagnosis not present

## 2023-10-12 DIAGNOSIS — E039 Hypothyroidism, unspecified: Secondary | ICD-10-CM | POA: Diagnosis not present

## 2023-10-12 DIAGNOSIS — Z23 Encounter for immunization: Secondary | ICD-10-CM | POA: Diagnosis not present

## 2023-10-12 DIAGNOSIS — I10 Essential (primary) hypertension: Secondary | ICD-10-CM | POA: Diagnosis not present

## 2023-10-12 MED ORDER — LISINOPRIL 20 MG PO TABS
20.0000 mg | ORAL_TABLET | Freq: Every evening | ORAL | 4 refills | Status: DC
  Filled 2023-10-12 – 2023-11-04 (×2): qty 90, 90d supply, fill #0

## 2023-10-12 MED ORDER — LEVOTHYROXINE SODIUM 137 MCG PO TABS
137.0000 ug | ORAL_TABLET | Freq: Every day | ORAL | 4 refills | Status: DC
  Filled 2023-10-12 – 2023-11-04 (×2): qty 90, 90d supply, fill #0
  Filled 2024-02-14: qty 90, 90d supply, fill #1

## 2023-10-12 MED ORDER — AMLODIPINE BESYLATE 5 MG PO TABS
5.0000 mg | ORAL_TABLET | Freq: Every day | ORAL | 4 refills | Status: AC
  Filled 2023-10-12 – 2023-11-04 (×2): qty 90, 90d supply, fill #0
  Filled 2024-02-10: qty 90, 90d supply, fill #1
  Filled 2024-02-14 – 2024-08-03 (×2): qty 90, 90d supply, fill #2

## 2023-10-12 MED ORDER — LOVASTATIN 40 MG PO TABS
40.0000 mg | ORAL_TABLET | Freq: Every day | ORAL | 4 refills | Status: AC
  Filled 2023-10-12 – 2023-11-04 (×2): qty 90, 90d supply, fill #0
  Filled 2024-02-14: qty 90, 90d supply, fill #1
  Filled 2024-08-03: qty 90, 90d supply, fill #2

## 2023-10-12 NOTE — Assessment & Plan Note (Signed)
 Chronic, stable. Continue current regimen.

## 2023-10-12 NOTE — Assessment & Plan Note (Signed)
 Preventative protocols reviewed and updated unless pt declined. Discussed healthy diet and lifestyle.

## 2023-10-12 NOTE — Assessment & Plan Note (Signed)
Chronic, stable. Continue levothyroxine. 

## 2023-10-12 NOTE — Progress Notes (Signed)
 Ph: [phone] Fax: [phone]   Patient ID: Jonathan Thomas, male    DOB: Apr 04, 1966, 58 y.o.   MRN: [id number]  This visit was conducted in person.  BP 134/82   Pulse 73   Temp 98.1 F (36.7 C) (Oral)   Ht 5' 7.5 (1.715 m)   Wt 173 lb 2 oz (78.5 kg)   SpO2 96%   BMI 26.71 kg/m    CC: CPE Subjective:   HPI: Jonathan Thomas is a 58 y.o. male presenting on 10/12/2023 for Annual Exam   Recent knee surgery 08/2023 - he held ozempic  since then, then restarted last week.   Known DM, HTN on amlodipine  5mg  daily, lisinporil 20mg  daily, ozempic  0.5mg  weekly. Metformin  intolerance even at low dose. Diabetic eye exam requested from Dr Francella.  Hypothyroid - continues levothyroxine  137mcg daily.    Preventative: Colonoscopy 11/2015 - [facility], rpt 10 yrs [facility])  Prostate cancer screening - yearly  Lung cancer screening - not eligible  Flu shot - declined  COVID vaccine Pfizer 04/2020, 05/2020, no booster Tdap today Pneumococcal vaccine - declines, to consider. Shingrix - discussed, declines Seat belt use discussed Sunscreen use discussed, no changing moles on skin  Occ [facility]  Ex smoker - quit remotely Alcohol - social  Dentist - due Eye exam yearly    Lives with wife, daughter Occ: curator Edu: HS Activity: gym 3d/wk, enjoys 4 wheel riding, walking 5d/wk  Diet: good water, vegetables some     Relevant past medical, surgical, family and social history reviewed and updated as indicated. Interim medical history since our last visit reviewed. Allergies and medications reviewed and updated. Outpatient Medications Prior to Visit  Medication Sig Dispense Refill   glucose blood (FREESTYLE TEST STRIPS) test strip Use as directed once daily to check blood sugar. 100 each 0   OneTouch Delica Lancets 33G MISC Use as instructed to check blood sugar once daily 100 each 1   Semaglutide ,0.25 or 0.5MG /DOS, (OZEMPIC , 0.25 OR 0.5 MG/DOSE,) 2 MG/3ML SOPN Inject 0.5 mg as directed once a  week. (Patient taking differently: Inject 0.5 mg as directed every 14 (fourteen) days.) 3 mL 11   amLODipine  (NORVASC ) 5 MG tablet Take 1 tablet (5 mg total) by mouth daily. (Patient taking differently: Take 5 mg by mouth at bedtime.) 90 tablet 3   levothyroxine  (SYNTHROID ) 137 MCG tablet Take 1 tablet (137 mcg total) by mouth daily before breakfast. 90 tablet 0   lisinopril  (ZESTRIL ) 20 MG tablet Take 1 tablet (20 mg total) by mouth daily. (Patient taking differently: Take 20 mg by mouth at bedtime.) 90 tablet 0   lovastatin  (MEVACOR ) 40 MG tablet Take 1 tablet (40 mg total) by mouth at bedtime. 90 tablet 0   HYDROcodone -acetaminophen  (NORCO) 5-325 MG tablet Take 1-2 tablets by mouth every 4 (four) hours as needed for moderate pain (pain score 4-6). 10 tablet 0   HYDROcodone -acetaminophen  (NORCO) 5-325 MG tablet Take 1-2 tablets by mouth every 4 (four) hours as needed for moderate pain (pain score 4-6). 10 tablet 0   HYDROcodone -acetaminophen  (NORCO) 5-325 MG tablet Take 1-2 tablets by mouth every 4 (four) hours as needed for moderate pain (pain score 4-6). 10 tablet 0   No facility-administered medications prior to visit.     Per HPI unless specifically indicated in ROS section below Review of Systems  Constitutional:  Negative for activity change, appetite change, chills, fatigue, fever and unexpected weight change.  HENT:  Negative for hearing loss.  Eyes:  Negative for visual disturbance.  Respiratory:  Negative for cough, chest tightness, shortness of breath and wheezing.   Cardiovascular:  Negative for chest pain, palpitations and leg swelling.  Gastrointestinal:  Positive for abdominal pain (mild upper abd pain - now resolved). Negative for abdominal distention, blood in stool, constipation, diarrhea, nausea and vomiting.  Genitourinary:  Negative for difficulty urinating and hematuria.  Musculoskeletal:  Negative for arthralgias, myalgias and neck pain.  Skin:  Negative for rash.   Neurological:  Negative for dizziness, seizures, syncope and headaches.  Hematological:  Negative for adenopathy. Does not bruise/bleed easily.  Psychiatric/Behavioral:  Negative for dysphoric mood. The patient is not nervous/anxious.     Objective:  BP 134/82   Pulse 73   Temp 98.1 F (36.7 C) (Oral)   Ht 5' 7.5 (1.715 m)   Wt 173 lb 2 oz (78.5 kg)   SpO2 96%   BMI 26.71 kg/m   Wt Readings from Last 3 Encounters:  10/12/23 173 lb 2 oz (78.5 kg)  08/09/23 173 lb 11.6 oz (78.8 kg)  08/06/23 173 lb 11.6 oz (78.8 kg)      Physical Exam Vitals and nursing note reviewed.  Constitutional:      General: He is not in acute distress.    Appearance: Normal appearance. He is well-developed. He is not ill-appearing.  HENT:     Head: Normocephalic and atraumatic.     Right Ear: Hearing, tympanic membrane, ear canal and external ear normal.     Left Ear: Hearing, tympanic membrane, ear canal and external ear normal.     Mouth/Throat:     Mouth: Mucous membranes are moist.     Pharynx: Oropharynx is clear. No oropharyngeal exudate or posterior oropharyngeal erythema.  Eyes:     General: No scleral icterus.    Extraocular Movements: Extraocular movements intact.     Conjunctiva/sclera: Conjunctivae normal.     Pupils: Pupils are equal, round, and reactive to light.  Neck:     Thyroid : No thyroid  mass or thyromegaly.  Cardiovascular:     Rate and Rhythm: Normal rate and regular rhythm.     Pulses: Normal pulses.          Radial pulses are 2+ on the right side and 2+ on the left side.     Heart sounds: Normal heart sounds. No murmur heard. Pulmonary:     Effort: Pulmonary effort is normal. No respiratory distress.     Breath sounds: Normal breath sounds. No wheezing, rhonchi or rales.  Abdominal:     General: Bowel sounds are normal. There is no distension.     Palpations: Abdomen is soft. There is no mass.     Tenderness: There is no abdominal tenderness. There is no guarding or  rebound.     Hernia: No hernia is present.  Musculoskeletal:        General: Normal range of motion.     Cervical back: Normal range of motion and neck supple.     Right lower leg: No edema.     Left lower leg: No edema.  Lymphadenopathy:     Cervical: No cervical adenopathy.  Skin:    General: Skin is warm and dry.     Findings: No rash.  Neurological:     General: No focal deficit present.     Mental Status: He is alert and oriented to person, place, and time.  Psychiatric:        Mood and Affect: Mood normal.  Behavior: Behavior normal.        Thought Content: Thought content normal.        Judgment: Judgment normal.       Results for orders placed or performed in visit on 10/04/23  Vitamin B12   Collection Time: 10/04/23  7:31 AM  Result Value Ref Range   Vitamin B-12 277 211 - 911 pg/mL  PSA   Collection Time: 10/04/23  7:31 AM  Result Value Ref Range   PSA 2.10 0.10 - 4.00 ng/mL  TSH   Collection Time: 10/04/23  7:31 AM  Result Value Ref Range   TSH 4.19 0.35 - 5.50 uIU/mL  Comprehensive metabolic panel   Collection Time: 10/04/23  7:31 AM  Result Value Ref Range   Sodium 138 135 - 145 mEq/L   Potassium 4.4 3.5 - 5.1 mEq/L   Chloride 105 96 - 112 mEq/L   CO2 25 19 - 32 mEq/L   Glucose, Bld 151 (H) 70 - 99 mg/dL   BUN 14 6 - 23 mg/dL   Creatinine, Ser 8.99 0.40 - 1.50 mg/dL   Total Bilirubin 0.4 0.2 - 1.2 mg/dL   Alkaline Phosphatase 54 39 - 117 U/L   AST 16 0 - 37 U/L   ALT 21 0 - 53 U/L   Total Protein 7.2 6.0 - 8.3 g/dL   Albumin 4.5 3.5 - 5.2 g/dL   GFR 16.94 >39.99 mL/min   Calcium 9.6 8.4 - 10.5 mg/dL  Lipid panel   Collection Time: 10/04/23  7:31 AM  Result Value Ref Range   Cholesterol 189 0 - 200 mg/dL   Triglycerides 842.9 (H) 0.0 - 149.0 mg/dL   HDL 71.89 (L) >60.99 mg/dL   VLDL 68.5 0.0 - 59.9 mg/dL   LDL Cholesterol 869 (H) 0 - 99 mg/dL   Total CHOL/HDL Ratio 7    NonHDL 161.34   Microalbumin / creatinine urine ratio    Collection Time: 10/04/23  7:31 AM  Result Value Ref Range   Microalb, Ur 0.9 0.0 - 1.9 mg/dL   Creatinine,U 899.3 mg/dL   Microalb Creat Ratio 0.8 0.0 - 30.0 mg/g  Hemoglobin A1c   Collection Time: 10/04/23  7:31 AM  Result Value Ref Range   Hgb A1c MFr Bld 8.0 (H) 4.6 - 6.5 %    Assessment & Plan:   Problem List Items Addressed This Visit     Health maintenance examination - Primary (Chronic)   Preventative protocols reviewed and updated unless pt declined. Discussed healthy diet and lifestyle.       Type 2 diabetes mellitus with other specified complication (HCC)   Chronic, deteriorated off ozempic  (stopped in preparation for knee surgery, didn't restart until recently). Has now restarted 0.25mg  weekly - will continue this  RTC 3-4 mo DM f/u visit.  Metformin  intolerance.      Relevant Medications   lisinopril  (ZESTRIL ) 20 MG tablet   lovastatin  (MEVACOR ) 40 MG tablet   Essential hypertension   Chronic, stable. Continue current regimen      Relevant Medications   amLODipine  (NORVASC ) 5 MG tablet   lisinopril  (ZESTRIL ) 20 MG tablet   lovastatin  (MEVACOR ) 40 MG tablet   Hyperlipidemia associated with type 2 diabetes mellitus (HCC)   Chronic, above goal - has been taking lovastatin  40mg  every other day - will change to daily dosing.  Discussed trial of CoEnzyme Q10 supplement to help statin-myalgias  The 10-year ASCVD risk score (Arnett DK, et al., 2019) is: 27.6%   Values  used to calculate the score:     Age: 21 years     Sex: Male     Is Non-Hispanic African American: Yes     Diabetic: Yes     Tobacco smoker: No     Systolic Blood Pressure: 134 mmHg     Is BP treated: Yes     HDL Cholesterol: 28.1 mg/dL     Total Cholesterol: 189 mg/dL       Relevant Medications   amLODipine  (NORVASC ) 5 MG tablet   lisinopril  (ZESTRIL ) 20 MG tablet   lovastatin  (MEVACOR ) 40 MG tablet   Hypothyroidism   Chronic, stable. Continue levothyroxine .       Relevant Medications    levothyroxine  (SYNTHROID ) 137 MCG tablet   Low serum vitamin B12   Rec start MVI       Other Visit Diagnoses       Encounter for immunization       Relevant Orders   Tdap vaccine greater than or equal to 7yo IM (Completed)        Meds ordered this encounter  Medications   amLODipine  (NORVASC ) 5 MG tablet    Sig: Take 1 tablet (5 mg total) by mouth daily.    Dispense:  90 tablet    Refill:  4   levothyroxine  (SYNTHROID ) 137 MCG tablet    Sig: Take 1 tablet (137 mcg total) by mouth daily before breakfast.    Dispense:  90 tablet    Refill:  4   lisinopril  (ZESTRIL ) 20 MG tablet    Sig: Take 1 tablet (20 mg total) by mouth at bedtime.    Dispense:  90 tablet    Refill:  4   lovastatin  (MEVACOR ) 40 MG tablet    Sig: Take 1 tablet (40 mg total) by mouth at bedtime.    Dispense:  90 tablet    Refill:  4    Orders Placed This Encounter  Procedures   Tdap vaccine greater than or equal to 7yo IM    Patient Instructions  Tdap today  Consider pneumonia shot, consider shingles shots.  Schedule dentist appointment.  Continue ozempic  0.25mg  weekly for 1 month total then increase to 0.5mg  weekly. Let me know if any abdominal pain or trouble tolerating medicine.  Take lovastatin  40mg  daily, consider trial of over the counter supplement CoEnzyme Q10 50-100mg  daily for muscle health while on statin.  Return in 3-4 months for diabetes follow up visit   Follow up plan: Return in about 3 months (around 01/10/2024) for follow up visit.  Anton Blas, MD

## 2023-10-12 NOTE — Assessment & Plan Note (Addendum)
 Chronic, deteriorated off ozempic (stopped in preparation for knee surgery, didn't restart until recently). Has now restarted 0.25mg  weekly - will continue this  RTC 3-4 mo DM f/u visit.  Metformin intolerance.

## 2023-10-12 NOTE — Assessment & Plan Note (Signed)
 Chronic, above goal - has been taking lovastatin  40mg  every other day - will change to daily dosing.  Discussed trial of CoEnzyme Q10 supplement to help statin-myalgias  The 10-year ASCVD risk score (Arnett DK, et al., 2019) is: 27.6%   Values used to calculate the score:     Age: 58 years     Sex: Male     Is Non-Hispanic African American: Yes     Diabetic: Yes     Tobacco smoker: No     Systolic Blood Pressure: 134 mmHg     Is BP treated: Yes     HDL Cholesterol: 28.1 mg/dL     Total Cholesterol: 189 mg/dL

## 2023-10-12 NOTE — Assessment & Plan Note (Signed)
 Rec start MVI

## 2023-10-12 NOTE — Patient Instructions (Addendum)
 Tdap today  Consider pneumonia shot, consider shingles shots.  Schedule dentist appointment.  Continue ozempic  0.25mg  weekly for 1 month total then increase to 0.5mg  weekly. Let me know if any abdominal pain or trouble tolerating medicine.  Take lovastatin  40mg  daily, consider trial of over the counter supplement CoEnzyme Q10 50-100mg  daily for muscle health while on statin.  Return in 3-4 months for diabetes follow up visit

## 2023-10-26 ENCOUNTER — Other Ambulatory Visit: Payer: Self-pay

## 2023-11-04 ENCOUNTER — Other Ambulatory Visit: Payer: Self-pay

## 2023-11-04 MED FILL — Semaglutide Soln Pen-inj 0.25 or 0.5 MG/DOSE (2 MG/3ML): SUBCUTANEOUS | 28 days supply | Qty: 3 | Fill #1 | Status: AC

## 2023-12-22 ENCOUNTER — Ambulatory Visit: Payer: Self-pay | Admitting: Family Medicine

## 2023-12-22 NOTE — Telephone Encounter (Signed)
  Chief Complaint: fever Symptoms: intermittent fever, cough, congestion, chills, diarrhea Frequency: since monday Pertinent Negatives: Patient denies vomiting, stiff neck Disposition: [] ED /[] Urgent Care (no appt availability in office) / [x] Appointment(In office/virtual)/ []  [facility]/ [] Home Care/ [] Refused Recommended Disposition /[]  Mobile Bus/ []  Follow-up with PCP Additional Notes: Patient's wife called in reporting that patient has been running a "lowgrade fever" on and off since Monday. Patients wife also reports cough, congestion, chills, and 1 episode of diarrhea. Per protocol, appt scheduled next available tomorrow 3/20. Wife advised to call back with worsening symptoms. Patient verbalized understanding.   Copied from CRM [id number]. Topic: Clinical - Red Word Triage >> Dec 22, 2023 10:35 AM Dimitri Ped wrote: [facility] that prompted transfer to Nurse Triage: been running fever since Monday . Its been up and down . Dont know if he may have the flu or something Reason for Disposition  Fever present > 3 days (72 hours)  Answer Assessment - Initial Assessment Questions 1. TEMPERATURE: "What is the most recent temperature?"  "How was it measured?"      Intermittent, most recently checked 100.61F 2. ONSET: "When did the fever start?"      Monday 3. CHILLS: "Do you have chills?" If yes: "How bad are they?"  (e.g., none, mild, moderate, severe)   - NONE: no chills   - MILD: feeling cold   - MODERATE: feeling very cold, some shivering (feels better under a thick blanket)   - SEVERE: feeling extremely cold with shaking chills (general body shaking, rigors; even under a thick blanket)      mild 4. OTHER SYMPTOMS: "Do you have any other symptoms besides the fever?"  (e.g., abdomen pain, cough, diarrhea, earache, headache, sore throat, urination pain)     Cough, congestion, diarrhea 5. CAUSE: If there are no symptoms, ask: "What do you think is causing the fever?"       unsure 6. CONTACTS: "Does anyone else in the family have an infection?"     none 7. TREATMENT: "What have you done so far to treat this fever?" (e.g., medications)     tylenol 8. IMMUNOCOMPROMISE: "Do you have of the following: diabetes, HIV positive, splenectomy, cancer chemotherapy, chronic steroid treatment, transplant patient, etc."     none  Protocols used: Fever-A-AH

## 2023-12-23 ENCOUNTER — Other Ambulatory Visit: Payer: Self-pay

## 2023-12-23 ENCOUNTER — Ambulatory Visit (INDEPENDENT_AMBULATORY_CARE_PROVIDER_SITE_OTHER): Admitting: Family Medicine

## 2023-12-23 ENCOUNTER — Encounter: Payer: Self-pay | Admitting: Family Medicine

## 2023-12-23 VITALS — BP 138/68 | HR 74 | Temp 99.3°F | Ht 67.5 in | Wt 170.0 lb

## 2023-12-23 DIAGNOSIS — R6889 Other general symptoms and signs: Secondary | ICD-10-CM | POA: Diagnosis not present

## 2023-12-23 MED ORDER — OZEMPIC (0.25 OR 0.5 MG/DOSE) 2 MG/3ML ~~LOC~~ SOPN: 0.2500 mg | PEN_INJECTOR | SUBCUTANEOUS | Status: DC

## 2023-12-23 MED ORDER — OSELTAMIVIR PHOSPHATE 75 MG PO CAPS
75.0000 mg | ORAL_CAPSULE | Freq: Two times a day (BID) | ORAL | 0 refills | Status: DC
  Filled 2023-12-23: qty 10, 5d supply, fill #0

## 2023-12-23 NOTE — Progress Notes (Signed)
 Fever started 2 days ago, diarrhea after eating.  Having aches.  Initial sx was ST, then aches.  Quick onset.  He has a close contact with the flu- friend was dx'd with flu.  Temp 100.8 last night.  Taking tylenol for aches.  Had chills last night, improved with tylenol.  No blood in stool.  No vomiting.  Still making urine.  Some cough, better with nyquil and mucinex.  Can take a deep breath.   Meds, vitals, and allergies reviewed.   ROS: Per HPI unless specifically indicated in ROS section   Ncat Nad TM wnl B MMM  OP wnl Neck supple, no LA Rrr Ctab, no focal dec in BS Abd soft, not ttp Ext w/o edema.   Defer flu testing given known exposure.

## 2023-12-23 NOTE — Patient Instructions (Signed)
 Rest, fluids.  Update [place] as needed.  Start tamiflu if any worse.  If getting better, okay to defer.  Take care.  Glad to see you. I would get a flu shot each fall.

## 2023-12-23 NOTE — Assessment & Plan Note (Signed)
 Rest, fluids.  Update [place] as needed.  Start tamiflu if any worse.  If getting better, okay to defer.  Flu shot encouraged.  He'll think about it.

## 2024-01-10 ENCOUNTER — Ambulatory Visit: Payer: 59 | Admitting: Family Medicine

## 2024-01-10 ENCOUNTER — Encounter: Payer: Self-pay | Admitting: Family Medicine

## 2024-01-10 VITALS — BP 158/84 | HR 56 | Temp 98.3°F | Ht 67.5 in | Wt 175.5 lb

## 2024-01-10 DIAGNOSIS — E1169 Type 2 diabetes mellitus with other specified complication: Secondary | ICD-10-CM | POA: Diagnosis not present

## 2024-01-10 DIAGNOSIS — Z7985 Long-term (current) use of injectable non-insulin antidiabetic drugs: Secondary | ICD-10-CM | POA: Diagnosis not present

## 2024-01-10 DIAGNOSIS — I10 Essential (primary) hypertension: Secondary | ICD-10-CM

## 2024-01-10 LAB — POCT GLYCOSYLATED HEMOGLOBIN (HGB A1C): Hemoglobin A1C: 7 % — AB (ref 4.0–5.6)

## 2024-01-10 NOTE — Assessment & Plan Note (Signed)
 Chronic, deteriorated. He notes he may have had more salt over weekend diet - japanese food and ate at family member's house.  He also notes e takes BP meds in evenings - rec switch to AM dosing. BP log sheet provided today - he will start monitoring BP more closely at home and let me know if consistently >140/90.  RTC 3 mo HTN f/u visit.

## 2024-01-10 NOTE — Assessment & Plan Note (Signed)
 Chronic, improved control with A1c down to 7%.  Discussed ozempic dosing - he has trouble tolerating 0.5mg  weekly dosing - will drop to Q10-14 days, update with effect.

## 2024-01-10 NOTE — Patient Instructions (Addendum)
 You are doing well today.  Blood pressures were higher today - start taking both BP medicines in the mornings.  BP log sheet provided today - start monitoring at home, jot down readings, and bring me some #s to review in next few weeks.  Try spacing out ozempic dosing - to every 1.5-2 weeks. Whatever you can tolerate better. Let me know if still having trouble. Return in 3 months for blood pressure recheck. Let me know sooner if consistently >140/90.

## 2024-01-10 NOTE — Progress Notes (Signed)
 Ph: [phone] Fax: [phone]   Patient ID: Jonathan Thomas, male    DOB: [birth year], 58 y.o.   MRN: [id number]  This visit was conducted in person.  BP (!) 158/84   Pulse (!) 56   Temp 98.3 F (36.8 C) (Oral)   Ht 5' 7.5" (1.715 m)   Wt 175 lb 8 oz (79.6 kg)   SpO2 97%   BMI 27.08 kg/m   BP Readings from Last 3 Encounters:  01/10/24 (!) 158/84  12/23/23 138/68  10/12/23 134/82  Elevated on repeat testing 170s/100s  CC: DM f/u  Subjective:   HPI: Jonathan Thomas is a 58 y.o. male presenting on 01/10/2024 for Medical Management of Chronic Issues (Here for 3 mo DM f/u.)   HTN - Compliant with current antihypertensive regimen of amlodipine 5mg  daily, lisinopril 20mg  daily.  Does not check blood pressures at home. No low blood pressure readings or symptoms of dizziness/syncope. Denies HA, vision changes, CP/tightness, SOB, leg swelling.    DM - does regularly check sugars peak 120. Compliant with antihyperglycemic regimen which includes: ozempic 0.25-0.5 mg weekly. Trouble tolerating 0.5mg  weekly dose - discussed spacing out dosing. Denies low sugars or hypoglycemic symptoms. Denies paresthesias, blurry vision. Last diabetic eye exam 06/2023. Glucometer brand: one touch. Last foot exam: DUE. DSME: has not completed. Lab Results  Component Value Date   HGBA1C 7.0 (A) 01/10/2024   Diabetic Foot Exam - Simple   Simple Foot Form Diabetic Foot exam was performed with the following findings: Yes 01/10/2024  8:24 AM  Visual Inspection No deformities, no ulcerations, no other skin breakdown bilaterally: Yes Sensation Testing Intact to touch and monofilament testing bilaterally: Yes Pulse Check Posterior Tibialis and Dorsalis pulse intact bilaterally: Yes Comments No claudication    Lab Results  Component Value Date   MICROALBUR 0.9 10/04/2023         Relevant past medical, surgical, family and social history reviewed and updated as indicated. Interim medical history  since our last visit reviewed. Allergies and medications reviewed and updated. Outpatient Medications Prior to Visit  Medication Sig Dispense Refill   amLODipine (NORVASC) 5 MG tablet Take 1 tablet (5 mg total) by mouth daily. 90 tablet 4   glucose blood (FREESTYLE TEST STRIPS) test strip Use as directed once daily to check blood sugar. 100 each 0   levothyroxine (SYNTHROID) 137 MCG tablet Take 1 tablet (137 mcg total) by mouth daily before breakfast. 90 tablet 4   lovastatin (MEVACOR) 40 MG tablet Take 1 tablet (40 mg total) by mouth at bedtime. 90 tablet 4   Semaglutide,0.25 or 0.5MG /DOS, (OZEMPIC, 0.25 OR 0.5 MG/DOSE,) 2 MG/3ML SOPN Inject 0.25 mg as directed once a week.     lisinopril (ZESTRIL) 20 MG tablet Take 1 tablet (20 mg total) by mouth at bedtime. 90 tablet 4   OneTouch Delica Lancets 33G MISC Use as instructed to check blood sugar once daily 100 each 1   oseltamivir (TAMIFLU) 75 MG capsule Take 1 capsule (75 mg total) by mouth 2 (two) times daily. 10 capsule 0   lisinopril (ZESTRIL) 20 MG tablet Take 1 tablet (20 mg total) by mouth daily.     No facility-administered medications prior to visit.     Per HPI unless specifically indicated in ROS section below Review of Systems  Objective:  BP (!) 158/84   Pulse (!) 56   Temp 98.3 F (36.8 C) (Oral)   Ht 5' 7.5" (1.715 m)   Wt 175  lb 8 oz (79.6 kg)   SpO2 97%   BMI 27.08 kg/m   Wt Readings from Last 3 Encounters:  01/10/24 175 lb 8 oz (79.6 kg)  12/23/23 170 lb (77.1 kg)  10/12/23 173 lb 2 oz (78.5 kg)      Physical Exam Vitals and nursing note reviewed.  Constitutional:      Appearance: Normal appearance. He is not ill-appearing.  HENT:     Head: Normocephalic and atraumatic.  Eyes:     Extraocular Movements: Extraocular movements intact.     Conjunctiva/sclera: Conjunctivae normal.     Pupils: Pupils are equal, round, and reactive to light.  Cardiovascular:     Rate and Rhythm: Normal rate and regular  rhythm.     Pulses: Normal pulses.     Heart sounds: Normal heart sounds. No murmur heard. Pulmonary:     Effort: Pulmonary effort is normal. No respiratory distress.     Breath sounds: Normal breath sounds. No wheezing, rhonchi or rales.  Musculoskeletal:     Right lower leg: No edema.     Left lower leg: No edema.     Comments: See HPI for foot exam if done  Skin:    General: Skin is warm and dry.     Findings: No rash.  Neurological:     Mental Status: He is alert.  Psychiatric:        Mood and Affect: Mood normal.        Behavior: Behavior normal.       Results for orders placed or performed in visit on 01/10/24  POCT glycosylated hemoglobin (Hb A1C)   Collection Time: 01/10/24  8:15 AM  Result Value Ref Range   Hemoglobin A1C 7.0 (A) 4.0 - 5.6 %   HbA1c POC (<> result, manual entry)     HbA1c, POC (prediabetic range)     HbA1c, POC (controlled diabetic range)     Lab Results  Component Value Date   VITAMINB12 277 10/04/2023   Assessment & Plan:   Problem List Items Addressed This Visit     Type 2 diabetes mellitus with other specified complication (HCC) - Primary   Chronic, improved control with A1c down to 7%.  Discussed ozempic dosing - he has trouble tolerating 0.5mg  weekly dosing - will drop to Q10-14 days, update with effect.      Relevant Medications   lisinopril (ZESTRIL) 20 MG tablet   Other Relevant Orders   POCT glycosylated hemoglobin (Hb A1C) (Completed)   Essential hypertension   Chronic, deteriorated. He notes he may have had more salt over weekend diet - japanese food and ate at family member's house.  He also notes e takes BP meds in evenings - rec switch to AM dosing. BP log sheet provided today - he will start monitoring BP more closely at home and let me know if consistently >140/90.  RTC 3 mo HTN f/u visit.      Relevant Medications   lisinopril (ZESTRIL) 20 MG tablet     No orders of the defined types were placed in this  encounter.   Orders Placed This Encounter  Procedures   POCT glycosylated hemoglobin (Hb A1C)    Patient Instructions  You are doing well today.  Blood pressures were higher today - start taking both BP medicines in the mornings.  BP log sheet provided today - start monitoring at home, jot down readings, and bring me some #s to review in next few weeks.  Try spacing  out ozempic dosing - to every 1.5-2 weeks. Whatever you can tolerate better. Let me know if still having trouble. Return in 3 months for blood pressure recheck. Let me know sooner if consistently >140/90.   Follow up plan: Return in about 3 months (around 04/10/2024) for follow up visit.  Eustaquio Boyden, MD

## 2024-02-14 ENCOUNTER — Other Ambulatory Visit: Payer: Self-pay

## 2024-02-14 ENCOUNTER — Other Ambulatory Visit: Payer: Self-pay | Admitting: Family Medicine

## 2024-02-14 DIAGNOSIS — I10 Essential (primary) hypertension: Secondary | ICD-10-CM

## 2024-02-14 MED ORDER — LISINOPRIL 20 MG PO TABS
20.0000 mg | ORAL_TABLET | Freq: Every day | ORAL | 0 refills | Status: DC
  Filled 2024-02-14: qty 90, 90d supply, fill #0

## 2024-05-23 ENCOUNTER — Other Ambulatory Visit: Payer: Self-pay | Admitting: Family Medicine

## 2024-05-23 ENCOUNTER — Other Ambulatory Visit: Payer: Self-pay

## 2024-05-23 DIAGNOSIS — I10 Essential (primary) hypertension: Secondary | ICD-10-CM

## 2024-05-24 ENCOUNTER — Other Ambulatory Visit: Payer: Self-pay

## 2024-05-24 ENCOUNTER — Telehealth: Payer: Self-pay

## 2024-05-24 DIAGNOSIS — I10 Essential (primary) hypertension: Secondary | ICD-10-CM

## 2024-05-24 MED ORDER — LISINOPRIL 20 MG PO TABS
20.0000 mg | ORAL_TABLET | Freq: Every day | ORAL | 1 refills | Status: AC
  Filled 2024-05-24: qty 90, 90d supply, fill #0
  Filled 2024-08-03: qty 90, 90d supply, fill #1

## 2024-05-24 NOTE — Telephone Encounter (Addendum)
ERx - plz notify pt 

## 2024-05-24 NOTE — Telephone Encounter (Signed)
 Copied from CRM [id number]. Topic: Appointments - Appointment Scheduling >> May 24, 2024 11:44 AM Jonathan Thomas wrote: 8Patient/patient representative is calling to schedule an appointment. Refer to attachments for appointment information. Patient scheduled a med management appointment for 06/02/2024 but is completely out of Rx #: [id number]  lisinopril  (ZESTRIL ) 20 MG tablet [[id number]] . He would like to know if he can get a refill to hold him over until his appointment.

## 2024-05-24 NOTE — Addendum Note (Signed)
 Addended by: RILLA BALLER on: 05/24/2024 05:37 PM   Modules accepted: Orders

## 2024-05-25 NOTE — Telephone Encounter (Signed)
 Left message to return call to office. Ok to [employer] below.

## 2024-05-26 NOTE — Telephone Encounter (Signed)
 Spoke with pt notifying him lisinopril  refill sent to pharmacy. Pt expresses his thanks.

## 2024-06-02 ENCOUNTER — Encounter: Payer: Self-pay | Admitting: Family Medicine

## 2024-06-02 ENCOUNTER — Ambulatory Visit: Admitting: Family Medicine

## 2024-06-02 VITALS — BP 122/88 | HR 63 | Temp 98.1°F | Ht 67.5 in | Wt 162.0 lb

## 2024-06-02 DIAGNOSIS — I10 Essential (primary) hypertension: Secondary | ICD-10-CM | POA: Diagnosis not present

## 2024-06-02 DIAGNOSIS — E039 Hypothyroidism, unspecified: Secondary | ICD-10-CM

## 2024-06-02 DIAGNOSIS — E1169 Type 2 diabetes mellitus with other specified complication: Secondary | ICD-10-CM

## 2024-06-02 LAB — BASIC METABOLIC PANEL WITH GFR
BUN: 12 mg/dL (ref 6–23)
CO2: 27 meq/L (ref 19–32)
Calcium: 9 mg/dL (ref 8.4–10.5)
Chloride: 105 meq/L (ref 96–112)
Creatinine, Ser: 0.81 mg/dL (ref 0.40–1.50)
GFR: 96.84 mL/min (ref >60.00)
Glucose, Bld: 131 mg/dL — ABNORMAL HIGH (ref 70–99)
Potassium: 4.3 meq/L (ref 3.5–5.1)
Sodium: 140 meq/L (ref 135–145)

## 2024-06-02 LAB — CBC WITH DIFFERENTIAL/PLATELET
Basophils Absolute: 0 K/uL (ref 0.0–0.1)
Basophils Relative: 0.3 % (ref 0.0–3.0)
Eosinophils Absolute: 0.2 K/uL (ref 0.0–0.7)
Eosinophils Relative: 2.2 % (ref 0.0–5.0)
HCT: 42.3 % (ref 39.0–52.0)
Hemoglobin: 14 g/dL (ref 13.0–17.0)
Lymphocytes Relative: 24.1 % (ref 12.0–46.0)
Lymphs Abs: 2 K/uL (ref 0.7–4.0)
MCHC: 33.2 g/dL (ref 30.0–36.0)
MCV: 97.2 fl (ref 78.0–100.0)
Monocytes Absolute: 0.9 K/uL (ref 0.1–1.0)
Monocytes Relative: 11.4 % (ref 3.0–12.0)
Neutro Abs: 5.1 K/uL (ref 1.4–7.7)
Neutrophils Relative %: 62 % (ref 43.0–77.0)
Platelets: 245 K/uL (ref 150.0–400.0)
RBC: 4.35 Mil/uL (ref 4.22–5.81)
RDW: 13.8 % (ref 11.5–15.5)
WBC: 8.2 K/uL (ref 4.0–10.5)

## 2024-06-02 LAB — POCT GLYCOSYLATED HEMOGLOBIN (HGB A1C): Hemoglobin A1C: 6.4 % — AB (ref 4.0–5.6)

## 2024-06-02 LAB — TSH: TSH: 0.07 u[IU]/mL — ABNORMAL LOW (ref 0.35–5.50)

## 2024-06-02 NOTE — Patient Instructions (Addendum)
 Hold ozempic  for now. Monitor weight and sugars, let me know if fasting sugar increasing past 150.  Watch weight.  Blood pressures are doing better.  Labs today  Return in 4 months for physical

## 2024-06-02 NOTE — Assessment & Plan Note (Addendum)
 Chronic, continued improvement noted in setting of weight loss. Still difficulty tolerating even low dose ozempic  0.25mg  q2 wks - will stop this and reassess sugar control next visit at physical. Pt agrees with plan.  With recent weight loss - update labs including CBC, TSH

## 2024-06-02 NOTE — Progress Notes (Signed)
 Ph: [phone] Fax: [phone]   Patient ID: Jonathan Thomas, male    DOB: [birth year], 58 y.o.   MRN: [id number]  This visit was conducted in person.  BP 122/88   Pulse 63   Temp 98.1 F (36.7 C) (Oral)   Ht 5' 7.5 (1.715 m)   Wt 162 lb (73.5 kg)   SpO2 99%   BMI 25.00 kg/m    CC: 3 mo f/u visit  Subjective:   HPI: Jonathan Thomas is a 58 y.o. male presenting on 06/02/2024 for Medical Management of Chronic Issues (Pt here for 3 mth f/u )   12 lb weight loss over the past 4 months - ozempic  related.  No fevers/chills, cough, night sweats, abd pain, nausea/vomiting diarrhea or constipation. No blood in stool or urine.   HTN - Compliant with current antihypertensive regimen of amlodipine  5mg  daily, lisinopril  20mg  daily.  Does not regularly check blood pressures at home. No low blood pressure readings or symptoms of dizziness/syncope. Denies HA, vision changes, CP/tightness, SOB, leg swelling.    DM - does regularly check sugars - 120 this morning. Compliant with antihyperglycemic regimen which includes: ozempic  0.5mg  Q14 days. Continued trouble tolerating even at q2wk dose. Denies low sugars or hypoglycemic symptoms. Denies paresthesias, blurry vision. Last diabetic eye exam 06/2023. Glucometer brand: freestyle. Last foot exam: 01/2024. DSME: not completed. Lab Results  Component Value Date   HGBA1C 6.4 (A) 06/02/2024   Diabetic Foot Exam - Simple   No data filed    No results found for: MAC[place] CURRENT      Relevant past medical, surgical, family and social history reviewed and updated as indicated. Interim medical history since our last visit reviewed. Allergies and medications reviewed and updated. Outpatient Medications Prior to Visit  Medication Sig Dispense Refill   amLODipine  (NORVASC ) 5 MG tablet Take 1 tablet (5 mg total) by mouth daily. 90 tablet 4   glucose blood (FREESTYLE TEST STRIPS) test strip Use as directed once daily to check blood  sugar. 100 each 0   levothyroxine  (SYNTHROID ) 137 MCG tablet Take 1 tablet (137 mcg total) by mouth daily before breakfast. 90 tablet 4   lisinopril  (ZESTRIL ) 20 MG tablet Take 1 tablet (20 mg total) by mouth daily. 90 tablet 1   lovastatin  (MEVACOR ) 40 MG tablet Take 1 tablet (40 mg total) by mouth at bedtime. 90 tablet 4   Semaglutide ,0.25 or 0.5MG /DOS, (OZEMPIC , 0.25 OR 0.5 MG/DOSE,) 2 MG/3ML SOPN Inject 0.25 mg as directed once a week.     No facility-administered medications prior to visit.     Per HPI unless specifically indicated in ROS section below Review of Systems  Objective:  BP 122/88   Pulse 63   Temp 98.1 F (36.7 C) (Oral)   Ht 5' 7.5 (1.715 m)   Wt 162 lb (73.5 kg)   SpO2 99%   BMI 25.00 kg/m   Wt Readings from Last 3 Encounters:  06/02/24 162 lb (73.5 kg)  01/10/24 175 lb 8 oz (79.6 kg)  12/23/23 170 lb (77.1 kg)      Physical Exam Vitals and nursing note reviewed.  Constitutional:      Appearance: Normal appearance. He is not ill-appearing.  HENT:     Head: Normocephalic and atraumatic.     Mouth/Throat:     Mouth: Mucous membranes are moist.     Pharynx: Oropharynx is clear. No oropharyngeal exudate or posterior oropharyngeal erythema.  Eyes:     Extraocular Movements:  Extraocular movements intact.     Conjunctiva/sclera: Conjunctivae normal.     Pupils: Pupils are equal, round, and reactive to light.  Cardiovascular:     Rate and Rhythm: Normal rate and regular rhythm.     Pulses: Normal pulses.     Heart sounds: Normal heart sounds. No murmur heard. Pulmonary:     Effort: Pulmonary effort is normal. No respiratory distress.     Breath sounds: Normal breath sounds. No wheezing, rhonchi or rales.  Musculoskeletal:     Right lower leg: No edema.     Left lower leg: No edema.     Comments: See HPI for foot exam if done  Skin:    General: Skin is warm and dry.     Findings: No rash.  Neurological:     Mental Status: He is alert.  Psychiatric:         Mood and Affect: Mood normal.        Behavior: Behavior normal.       Results for orders placed or performed in visit on 06/02/24  POCT glycosylated hemoglobin (Hb A1C)   Collection Time: 06/02/24  7:47 AM  Result Value Ref Range   Hemoglobin A1C 6.4 (A) 4.0 - 5.6 %   HbA1c POC (<> result, manual entry)     HbA1c, POC (prediabetic range)     HbA1c, POC (controlled diabetic range)     Lab Results  Component Value Date   WBC 10.3 08/06/2023   HGB 14.3 08/06/2023   HCT 40.9 08/06/2023   MCV 95.6 08/06/2023   PLT 304 08/06/2023    Lab Results  Component Value Date   NA 138 10/04/2023   CL 105 10/04/2023   K 4.4 10/04/2023   CO2 25 10/04/2023   BUN 14 10/04/2023   CREATININE 1.00 10/04/2023   GFR 83.05 10/04/2023   CALCIUM 9.6 10/04/2023   ALBUMIN 4.5 10/04/2023   GLUCOSE 151 (H) 10/04/2023    Lab Results  Component Value Date   TSH 4.19 10/04/2023    Assessment & Plan:   Problem List Items Addressed This Visit     Type 2 diabetes mellitus with other specified complication (HCC)   Chronic, continued improvement noted in setting of weight loss. Still difficulty tolerating even low dose ozempic  0.25mg  q2 wks - will stop this and reassess sugar control next visit at physical. Pt agrees with plan.  With recent weight loss - update labs including CBC, TSH       Relevant Orders   POCT glycosylated hemoglobin (Hb A1C) (Completed)   Essential hypertension - Primary   Chronic, stable on current regimen - continue.       Relevant Orders   TSH   CBC with Differential/Platelet   Basic metabolic panel with GFR   Hypothyroidism   Chronic, update TSH with recent weight loss.  He continues levothyroxine  137mcg daily.       Relevant Orders   TSH   CBC with Differential/Platelet   Basic metabolic panel with GFR     No orders of the defined types were placed in this encounter.   Orders Placed This Encounter  Procedures   TSH   CBC with Differential/Platelet    Basic metabolic panel with GFR   POCT glycosylated hemoglobin (Hb A1C)    Patient Instructions  Hold ozempic  for now. Monitor weight and sugars, let me know if fasting sugar increasing past 150.  Watch weight.  Blood pressures are doing better.  Labs today  Return in 4 months for physical  Follow up plan: Return in about 4 months (around 10/11/2024) for annual exam, prior fasting for blood work.  Anton Blas, MD

## 2024-06-02 NOTE — Assessment & Plan Note (Signed)
 Chronic, stable on current regimen - continue.

## 2024-06-02 NOTE — Assessment & Plan Note (Addendum)
 Chronic, update TSH with recent weight loss.  He continues levothyroxine  137mcg daily.

## 2024-06-03 ENCOUNTER — Ambulatory Visit: Payer: Self-pay | Admitting: Family Medicine

## 2024-06-03 DIAGNOSIS — E039 Hypothyroidism, unspecified: Secondary | ICD-10-CM

## 2024-06-03 MED ORDER — LEVOTHYROXINE SODIUM 125 MCG PO TABS
125.0000 ug | ORAL_TABLET | Freq: Every day | ORAL | 1 refills | Status: AC
  Filled 2024-06-03 – 2024-06-21 (×2): qty 90, 90d supply, fill #0

## 2024-06-05 ENCOUNTER — Other Ambulatory Visit: Payer: Self-pay

## 2024-06-06 NOTE — Telephone Encounter (Signed)
 Last read by Koren JULIANNA Browner at 11:49AM on 06/03/2024.

## 2024-06-16 ENCOUNTER — Other Ambulatory Visit: Payer: Self-pay

## 2024-06-21 ENCOUNTER — Other Ambulatory Visit: Payer: Self-pay

## 2024-07-14 LAB — OPHTHALMOLOGY REPORT-SCANNED

## 2024-08-03 ENCOUNTER — Other Ambulatory Visit: Payer: Self-pay
